# Patient Record
Sex: Female | Born: 1945
Health system: Southern US, Community
[De-identification: ages and names within clinical notes are randomized; demographics above are authoritative.]

## PROBLEM LIST (undated history)

## (undated) DIAGNOSIS — F32A Depression, unspecified: Secondary | ICD-10-CM

## (undated) DIAGNOSIS — I10 Essential (primary) hypertension: Secondary | ICD-10-CM

## (undated) DIAGNOSIS — E079 Disorder of thyroid, unspecified: Secondary | ICD-10-CM

## (undated) DIAGNOSIS — E785 Hyperlipidemia, unspecified: Secondary | ICD-10-CM

## (undated) HISTORY — DX: Disorder of thyroid, unspecified: E07.9

## (undated) HISTORY — DX: Essential (primary) hypertension: I10

## (undated) HISTORY — DX: Depression, unspecified: F32.A

## (undated) HISTORY — PX: OTHER SURGICAL HISTORY: SHX169

## (undated) HISTORY — DX: Hyperlipidemia, unspecified: E78.5

## (undated) HISTORY — PX: TUBAL LIGATION: SHX77

---

## 2013-07-23 ENCOUNTER — Ambulatory Visit: Payer: Self-pay | Admitting: Family Medicine

## 2013-08-25 ENCOUNTER — Ambulatory Visit (INDEPENDENT_AMBULATORY_CARE_PROVIDER_SITE_OTHER): Payer: Medicare Other | Admitting: Family Medicine

## 2013-08-25 VITALS — BP 126/80 | HR 66 | Temp 97.8°F | Resp 16 | Wt 164.0 lb

## 2013-08-25 DIAGNOSIS — R748 Abnormal levels of other serum enzymes: Secondary | ICD-10-CM

## 2013-08-25 DIAGNOSIS — E039 Hypothyroidism, unspecified: Secondary | ICD-10-CM

## 2013-08-25 DIAGNOSIS — I10 Essential (primary) hypertension: Secondary | ICD-10-CM

## 2013-08-25 LAB — CBC
HEMATOCRIT: 43.6 % (ref 36.0–46.0)
Hemoglobin: 14.8 g/dL (ref 12.0–15.0)
MCH: 27.2 pg (ref 26.0–34.0)
MCHC: 33.9 g/dL (ref 30.0–36.0)
MCV: 80.1 fL (ref 78.0–100.0)
PLATELETS: 298 10*3/uL (ref 150–400)
RBC: 5.44 MIL/uL — ABNORMAL HIGH (ref 3.87–5.11)
RDW: 15.1 % (ref 11.5–15.5)
WBC: 4.9 10*3/uL (ref 4.0–10.5)

## 2013-08-25 MED ORDER — LEVOTHYROXINE SODIUM 300 MCG PO TABS
300.0000 ug | ORAL_TABLET | Freq: Every day | ORAL | Status: DC
Start: 2013-08-25 — End: 2013-08-28

## 2013-08-25 MED ORDER — POTASSIUM CHLORIDE CRYS ER 10 MEQ PO TBCR: 10.0000 meq | EXTENDED_RELEASE_TABLET | Freq: Every day | ORAL | Status: DC

## 2013-08-25 MED ORDER — DILTIAZEM HCL ER BEADS 180 MG PO CP24: 180.0000 mg | ORAL_CAPSULE | Freq: Every day | ORAL | Status: DC

## 2013-08-25 MED ORDER — HYDROCHLOROTHIAZIDE 25 MG PO TABS: 25.0000 mg | ORAL_TABLET | Freq: Every day | ORAL | Status: DC

## 2013-08-25 NOTE — Patient Instructions (Signed)
I will be in touch with your regarding your labs when they come in.  If you wish, you can wait and fill the synthroid until we get your TSH back in case we need to adjust.  Good to see you today!

## 2013-08-25 NOTE — Progress Notes (Addendum)
[facility] [address], [place] [address] [phone]  Date:  08/25/2013   Name:  Isabella Berg   DOB:  10/02/1945   MRN:  [id number]  PCP:  No primary provider on file.    Chief Complaint: Medication Refill and Establish PCP   History of Present Illness:  Isabella Berg is a 68 y.o. very pleasant female patient who presents with the following:  She is here today as a new patient- she has been taking care of her husband who passed a few months ago,  She is now having time to check up on herself.    No recent TSH check - she is stable on 300 mcg for some time.  She had a thyroidectomy, but did not have cancer  She declines a flu shot and shingles vaccine today  There are no active problems to display for this patient.   Past Medical History  Diagnosis Date  . Hypertension   . Thyroid disease   . Hyperlipidemia     Past Surgical History  Procedure Laterality Date  . Thyroid      Removal 2002    History  Substance Use Topics  . Smoking status: Former Games developer  . Smokeless tobacco: Not on file  . Alcohol Use: No    Family History  Problem Relation Age of Onset  . Hyperlipidemia Mother   . Cancer Father   . Hypertension Brother   . Stroke Brother   . Hyperlipidemia Brother     No Known Allergies  Medication list has been reviewed and updated.  No current outpatient prescriptions on file prior to visit.   No current facility-administered medications on file prior to visit.    Review of Systems:  As per HPI- otherwise negative.   Physical Examination: Filed Vitals:   08/25/13 1052  BP: 126/80  Pulse: 66  Temp: 97.8 F (36.6 C)  Resp: 16   Filed Vitals:   08/25/13 1052  Weight: 164 lb (74.39 kg)   There is no height on file to calculate BMI. Ideal Body Weight:    GEN: WDWN, NAD, Non-toxic, A & O x 3, mild overweight, looks well HEENT: Atraumatic, Normocephalic. Neck supple. No masses, No LAD. Ears and Nose: No  external deformity. CV: RRR, No M/G/R. No JVD. No thrill. No extra heart sounds. PULM: CTA B, no wheezes, crackles, rhonchi. No retractions. No resp. distress. No accessory muscle use. EXTR: No c/c/e NEURO Normal gait.  PSYCH: Normally interactive. Conversant. Not depressed or anxious appearing.  Calm demeanor.    Assessment and Plan: Unspecified hypothyroidism - Plan: levothyroxine (SYNTHROID, LEVOTHROID) 300 MCG tablet, TSH  HTN (hypertension) - Plan: hydrochlorothiazide (HYDRODIURIL) 25 MG tablet, diltiazem (TIAZAC) 180 MG 24 hr capsule, potassium chloride (K-DUR,KLOR-CON) 10 MEQ tablet, Lipid panel, Comprehensive metabolic panel, CBC  Refilled her medications and check labs as above today. She plans to see [place] as her PCP and will plan follow-up with her labs.  Meds ordered this encounter  Medications  . DISCONTD: diltiazem (TIAZAC) 180 MG 24 hr capsule    Sig: Take 180 mg by mouth daily.  Marland Kitchen DISCONTD: potassium chloride (K-DUR,KLOR-CON) 10 MEQ tablet    Sig: Take 10 mEq by mouth 2 (two) times daily.  . calcium-vitamin D (OSCAL WITH D) 500-200 MG-UNIT per tablet    Sig: Take 1 tablet by mouth.  . DISCONTD: levothyroxine (SYNTHROID, LEVOTHROID) 300 MCG tablet    Sig: Take 300 mcg by mouth daily before breakfast.  .  DISCONTD: hydrochlorothiazide (HYDRODIURIL) 25 MG tablet    Sig: Take 25 mg by mouth daily.  . cholecalciferol (VITAMIN D) 400 UNITS TABS tablet    Sig: Take 400 Units by mouth.  . levothyroxine (SYNTHROID, LEVOTHROID) 300 MCG tablet    Sig: Take 1 tablet (300 mcg total) by mouth daily before breakfast.    Dispense:  30 tablet    Refill:  11  . hydrochlorothiazide (HYDRODIURIL) 25 MG tablet    Sig: Take 1 tablet (25 mg total) by mouth daily.    Dispense:  90 tablet    Refill:  3  . diltiazem (TIAZAC) 180 MG 24 hr capsule    Sig: Take 1 capsule (180 mg total) by mouth daily.    Dispense:  30 capsule    Refill:  11  . potassium chloride (K-DUR,KLOR-CON) 10 MEQ tablet     Sig: Take 1 tablet (10 mEq total) by mouth daily.    Dispense:  30 tablet    Refill:  11    Signed Abbe AmsterdamJessica Eliyohu Class, MD  Called her to go over labs.  Her TSH is up a bit.  Admits she has not always been taking her medication as she should, would like to check again in one month.  Also, her cholesterol is up.  Discussed CHL medication.  She would like to try a trial of lifestyle changes first, and we will plan to check again in 6 months.   I will also recheck her AP in a month.

## 2013-08-26 ENCOUNTER — Encounter: Payer: Self-pay | Admitting: Family Medicine

## 2013-08-26 LAB — COMPREHENSIVE METABOLIC PANEL
ALT: 15 U/L (ref 0–35)
AST: 17 U/L (ref 0–37)
Albumin: 4.2 g/dL (ref 3.5–5.2)
Alkaline Phosphatase: 123 U/L — ABNORMAL HIGH (ref 39–117)
BUN: 13 mg/dL (ref 6–23)
CO2: 29 meq/L (ref 19–32)
CREATININE: 0.77 mg/dL (ref 0.50–1.10)
Calcium: 9.4 mg/dL (ref 8.4–10.5)
Chloride: 103 mEq/L (ref 96–112)
Glucose, Bld: 98 mg/dL (ref 70–99)
Potassium: 3.9 mEq/L (ref 3.5–5.3)
SODIUM: 142 meq/L (ref 135–145)
TOTAL PROTEIN: 6.9 g/dL (ref 6.0–8.3)
Total Bilirubin: 0.5 mg/dL (ref 0.2–1.2)

## 2013-08-26 LAB — LIPID PANEL
CHOL/HDL RATIO: 3.8 ratio
Cholesterol: 293 mg/dL — ABNORMAL HIGH (ref 0–200)
HDL: 78 mg/dL (ref >39)
LDL Cholesterol: 199 mg/dL — ABNORMAL HIGH (ref 0–99)
Triglycerides: 79 mg/dL (ref <150)
VLDL: 16 mg/dL (ref 0–40)

## 2013-08-26 LAB — TSH: TSH: 7.334 u[IU]/mL — AB (ref 0.350–4.500)

## 2013-08-26 NOTE — Addendum Note (Signed)
Addended by: Abbe AmsterdamOPLAND, Kadesha Virrueta C on: 08/26/2013 07:54 PM   Modules accepted: Orders

## 2013-08-28 ENCOUNTER — Other Ambulatory Visit: Payer: Self-pay | Admitting: Emergency Medicine

## 2013-08-28 DIAGNOSIS — E039 Hypothyroidism, unspecified: Secondary | ICD-10-CM

## 2013-08-28 MED ORDER — LEVOTHYROXINE SODIUM 88 MCG PO TABS: 88.0000 ug | ORAL_TABLET | Freq: Every day | ORAL | Status: DC

## 2014-08-15 ENCOUNTER — Encounter: Payer: Self-pay | Admitting: *Deleted

## 2014-08-23 ENCOUNTER — Other Ambulatory Visit: Payer: Self-pay | Admitting: Emergency Medicine

## 2014-09-06 ENCOUNTER — Other Ambulatory Visit: Payer: Self-pay | Admitting: Family Medicine

## 2014-09-07 ENCOUNTER — Encounter: Payer: Self-pay | Admitting: Family Medicine

## 2014-10-01 ENCOUNTER — Other Ambulatory Visit: Payer: Self-pay | Admitting: Physician Assistant

## 2014-10-01 DIAGNOSIS — E038 Other specified hypothyroidism: Secondary | ICD-10-CM

## 2014-10-03 ENCOUNTER — Other Ambulatory Visit: Payer: Self-pay | Admitting: Family Medicine

## 2014-10-03 MED ORDER — LEVOTHYROXINE SODIUM 300 MCG PO TABS: 300.0000 ug | ORAL_TABLET | Freq: Every day | ORAL | Status: DC

## 2014-10-03 NOTE — Telephone Encounter (Signed)
Pt has appt 12/12/14, but hasn't been seen since 08/2013. Do you want to give RFs until appt?

## 2014-10-03 NOTE — Telephone Encounter (Signed)
Dr Patsy Lageropland, pt has appt sch for 12/12/14. Do you want to RF these until then? Pt hasn't been seen since 08/2013.

## 2014-10-03 NOTE — Telephone Encounter (Signed)
I have not seen her in over a year.  She is on 300mcg of nthroid, no .  Made this correction

## 2014-10-04 NOTE — Telephone Encounter (Signed)
Did these refills but called her and LMOM- would really like to see her sooner than June. Her labs are way overdue. Please see me or someone else in the walk-in or try to move her apt up

## 2014-10-06 ENCOUNTER — Ambulatory Visit (INDEPENDENT_AMBULATORY_CARE_PROVIDER_SITE_OTHER): Payer: Medicare Other | Admitting: Family Medicine

## 2014-10-06 VITALS — BP 118/70 | HR 76 | Temp 98.2°F | Resp 18 | Ht 63.0 in | Wt 157.0 lb

## 2014-10-06 DIAGNOSIS — E038 Other specified hypothyroidism: Secondary | ICD-10-CM

## 2014-10-06 MED ORDER — LEVOTHYROXINE SODIUM 88 MCG PO TABS: 88.0000 ug | ORAL_TABLET | Freq: Every day | ORAL | Status: DC

## 2014-10-06 NOTE — Progress Notes (Signed)
[facility] [address], [place] [address] [phone]  Date:  10/06/2014   Name:  Isabella Berg   DOB:  25-Dec-1945   MRN:  [id number]  PCP:  Abbe AmsterdamOPLAND,Harlea Goetzinger, MD    Chief Complaint: Follow-up   History of Present Illness:  Isabella Berg is a 69 y.o. very pleasant female patient who presents with the following:  Here today to follow-up her hypothyroidism.  She is taking 88 mcg at home- she has NEVER been taking 300 mcg.  Unsure where this confusion came from.  Called the pharmacy and made sure that she had been getting the 88 mcg  There are no active problems to display for this patient.   Past Medical History  Diagnosis Date  . Hypertension   . Thyroid disease   . Hyperlipidemia     Past Surgical History  Procedure Laterality Date  . Thyroid      Removal 2002    History  Substance Use Topics  . Smoking status: Former Games developermoker  . Smokeless tobacco: Not on file  . Alcohol Use: No    Family History  Problem Relation Age of Onset  . Hyperlipidemia Mother   . Cancer Father   . Hypertension Brother   . Stroke Brother   . Hyperlipidemia Brother     No Known Allergies  Medication list has been reviewed and updated.  Current Outpatient Prescriptions on File Prior to Visit  Medication Sig Dispense Refill  . diltiazem (TIAZAC) 180 MG 24 hr capsule TAKE 1 CAPSULE (180 MG TOTAL) BY MOUTH DAILY. 30 capsule 2  . hydrochlorothiazide (HYDRODIURIL) 25 MG tablet TAKE 1 TABLET (25 MG TOTAL) BY MOUTH DAILY. 90 tablet 0  . KLOR-CON M10 10 MEQ tablet TAKE 1 TABLET (10 MEQ TOTAL) BY MOUTH DAILY. 30 tablet 2  . levothyroxine (SYNTHROID, LEVOTHROID) 300 MCG tablet Take 1 tablet (300 mcg total) by mouth daily before breakfast. 30 tablet 1   No current facility-administered medications on file prior to visit.    Review of Systems:  As per HPI- otherwise negative.     Physical Examination: Filed Vitals:   10/06/14 1535  BP: 118/70  Pulse: 76   Temp: 98.2 F (36.8 C)  Resp: 18   Filed Vitals:   10/06/14 1535  Height: 5\' 3"  (1.6 m)  Weight: 157 lb (71.215 kg)   Body mass index is 27.82 kg/(m^2). Ideal Body Weight: Weight in (lb) to have BMI = 25: 140.8  GEN: WDWN, NAD, Non-toxic, A & O x 3, overweight, looks well HEENT: Atraumatic, Normocephalic. Neck supple. No masses, No LAD. Ears and Nose: No external deformity. CV: RRR, No M/G/R. No JVD. No thrill. No extra heart sounds. PULM: CTA B, no wheezes, crackles, rhonchi. No retractions. No resp. distress. No accessory muscle use. EXTR: No c/c/e NEURO Normal gait.  PSYCH: Normally interactive. Conversant. Not depressed or anxious appearing.  Calm demeanor.    Assessment and Plan: Other specified hypothyroidism - Plan: TSH, levothyroxine (SYNTHROID, LEVOTHROID) 88 MCG tablet  Check TSH today.  She did not pick up the 300 mcg which is good because she has been taking 88 mcg.  We are unsure where this miscommunication occurred. I will call her with her TSH- she has enough thyroid medication to take today and tomorrow in case we need to adjust her dose  Signed Abbe AmsterdamJessica Cattleya Dobratz, MD

## 2014-10-06 NOTE — Patient Instructions (Signed)
We will check your TSH today- do not fill your thyroid medication until I call you in case we need to change your dose.

## 2014-10-07 ENCOUNTER — Encounter: Payer: Self-pay | Admitting: Family Medicine

## 2014-10-07 LAB — TSH: TSH: 1.224 u[IU]/mL (ref 0.350–4.500)

## 2014-11-25 ENCOUNTER — Encounter: Payer: Self-pay | Admitting: *Deleted

## 2014-12-12 ENCOUNTER — Encounter: Payer: Self-pay | Admitting: Family Medicine

## 2014-12-12 ENCOUNTER — Ambulatory Visit (INDEPENDENT_AMBULATORY_CARE_PROVIDER_SITE_OTHER): Payer: Medicare Other | Admitting: Family Medicine

## 2014-12-12 ENCOUNTER — Other Ambulatory Visit: Payer: Self-pay | Admitting: Family Medicine

## 2014-12-12 VITALS — BP 140/90 | HR 67 | Temp 97.8°F | Resp 16 | Ht 63.0 in | Wt 157.0 lb

## 2014-12-12 DIAGNOSIS — G5602 Carpal tunnel syndrome, left upper limb: Secondary | ICD-10-CM

## 2014-12-12 DIAGNOSIS — M8580 Other specified disorders of bone density and structure, unspecified site: Secondary | ICD-10-CM

## 2014-12-12 DIAGNOSIS — Z13220 Encounter for screening for lipoid disorders: Secondary | ICD-10-CM | POA: Diagnosis not present

## 2014-12-12 DIAGNOSIS — I10 Essential (primary) hypertension: Secondary | ICD-10-CM | POA: Diagnosis not present

## 2014-12-12 DIAGNOSIS — Z0000 Encounter for general adult medical examination without abnormal findings: Secondary | ICD-10-CM | POA: Diagnosis not present

## 2014-12-12 DIAGNOSIS — Z130 Encounter for screening for diseases of the blood and blood-forming organs and certain disorders involving the immune mechanism: Secondary | ICD-10-CM

## 2014-12-12 DIAGNOSIS — F4321 Adjustment disorder with depressed mood: Secondary | ICD-10-CM | POA: Diagnosis not present

## 2014-12-12 DIAGNOSIS — G5601 Carpal tunnel syndrome, right upper limb: Secondary | ICD-10-CM

## 2014-12-12 DIAGNOSIS — E890 Postprocedural hypothyroidism: Secondary | ICD-10-CM

## 2014-12-12 DIAGNOSIS — Z131 Encounter for screening for diabetes mellitus: Secondary | ICD-10-CM

## 2014-12-12 DIAGNOSIS — G5603 Carpal tunnel syndrome, bilateral upper limbs: Secondary | ICD-10-CM

## 2014-12-12 LAB — CBC
HEMATOCRIT: 43.3 % (ref 36.0–46.0)
HEMOGLOBIN: 14.9 g/dL (ref 12.0–15.0)
MCH: 27.6 pg (ref 26.0–34.0)
MCHC: 34.4 g/dL (ref 30.0–36.0)
MCV: 80.3 fL (ref 78.0–100.0)
MPV: 9.9 fL (ref 8.6–12.4)
Platelets: 285 10*3/uL (ref 150–400)
RBC: 5.39 MIL/uL — ABNORMAL HIGH (ref 3.87–5.11)
RDW: 14.8 % (ref 11.5–15.5)
WBC: 4 10*3/uL (ref 4.0–10.5)

## 2014-12-12 LAB — LIPID PANEL
CHOLESTEROL: 244 mg/dL — AB (ref 0–200)
HDL: 90 mg/dL (ref >=46)
LDL Cholesterol: 143 mg/dL — ABNORMAL HIGH (ref 0–99)
TRIGLYCERIDES: 54 mg/dL (ref <150)
Total CHOL/HDL Ratio: 2.7 Ratio
VLDL: 11 mg/dL (ref 0–40)

## 2014-12-12 LAB — COMPREHENSIVE METABOLIC PANEL
ALK PHOS: 129 U/L — AB (ref 39–117)
ALT: 13 U/L (ref 0–35)
AST: 15 U/L (ref 0–37)
Albumin: 4 g/dL (ref 3.5–5.2)
BUN: 13 mg/dL (ref 6–23)
CO2: 28 mEq/L (ref 19–32)
Calcium: 9.4 mg/dL (ref 8.4–10.5)
Chloride: 104 mEq/L (ref 96–112)
Creat: 0.78 mg/dL (ref 0.50–1.10)
GLUCOSE: 98 mg/dL (ref 70–99)
POTASSIUM: 3.7 meq/L (ref 3.5–5.3)
Sodium: 142 mEq/L (ref 135–145)
TOTAL PROTEIN: 7 g/dL (ref 6.0–8.3)
Total Bilirubin: 0.5 mg/dL (ref 0.2–1.2)

## 2014-12-12 LAB — TSH: TSH: 0.889 u[IU]/mL (ref 0.350–4.500)

## 2014-12-12 MED ORDER — DILTIAZEM HCL ER BEADS 180 MG PO CP24: ORAL_CAPSULE | ORAL | Status: DC

## 2014-12-12 MED ORDER — POTASSIUM CHLORIDE CRYS ER 10 MEQ PO TBCR: EXTENDED_RELEASE_TABLET | ORAL | Status: DC

## 2014-12-12 MED ORDER — HYDROCHLOROTHIAZIDE 25 MG PO TABS: ORAL_TABLET | ORAL | Status: DC

## 2014-12-12 NOTE — Progress Notes (Signed)
[facility] [address], [place] [address] [phone]  Date:  12/12/2014   Name:  Isabella Berg   DOB:  01-Sep-1945   MRN:  [id number]  PCP:  Abbe Amsterdam, MD    Chief Complaint: Annual Exam   History of Present Illness:  Isabella Berg is a 69 y.o. very pleasant female patient who presents with the following:  Here today for a CPE.  She has a history of HTN, hyperlipidemia and post- op hypothyroidism  Former smoker.  We had to adjust her thyroid medication in the spring but it was fine on recheck No history of abnormal pap- last pap in 2011 at the age of 16.  She is not SA Fasting today for labs- would like to have BW today  She has lost weight- her appetite has not been as good.  Her husband died 2 years ago- she went through counseling for her acute grief.  She cares for her mother, and her daughter and grandchild live here in [place] as well.  Overall she feels that she sometimes feels sad and quiet, but is not truly depressed and does not wish to use any medication for same.  Denies any SI  She does have a prior dx of CTS- made a few years ago.  No operation done.  For a few weeks now she has noted hand and wrist pain, worse in the am, in both hands.  Sx concentrated in her index and long fingers  She declines pneumonia vaccine, zostavax, and colonoscopy referral Noted to have osteopenia on her last dexa- ok with referral for this and for mammogram   Wt Readings from Last 3 Encounters:  12/12/14 157 lb (71.215 kg)  10/06/14 157 lb (71.215 kg)  08/25/13 164 lb (74.39 kg)     There are no active problems to display for this patient.   Past Medical History  Diagnosis Date  . Hypertension   . Thyroid disease   . Hyperlipidemia     Past Surgical History  Procedure Laterality Date  . Thyroid      Removal 2002    History  Substance Use Topics  . Smoking status: Former Games developer  . Smokeless tobacco: Not on file  . Alcohol Use: No     Family History  Problem Relation Age of Onset  . Hyperlipidemia Mother   . Cancer Father   . Hypertension Brother   . Stroke Brother   . Hyperlipidemia Brother     No Known Allergies  Medication list has been reviewed and updated.  Current Outpatient Prescriptions on File Prior to Visit  Medication Sig Dispense Refill  . diltiazem (TIAZAC) 180 MG 24 hr capsule TAKE 1 CAPSULE (180 MG TOTAL) BY MOUTH DAILY. 30 capsule 2  . hydrochlorothiazide (HYDRODIURIL) 25 MG tablet TAKE 1 TABLET (25 MG TOTAL) BY MOUTH DAILY. 90 tablet 0  . KLOR-CON M10 10 MEQ tablet TAKE 1 TABLET (10 MEQ TOTAL) BY MOUTH DAILY. 30 tablet 2  . levothyroxine (SYNTHROID, LEVOTHROID) 88 MCG tablet Take 1 tablet (88 mcg total) by mouth daily. 90 tablet 3   No current facility-administered medications on file prior to visit.    Review of Systems:  As per HPI- otherwise negative.   Physical Examination: Filed Vitals:   12/12/14 0936  BP: 144/88  Pulse: 67  Temp: 97.8 F (36.6 C)  Resp: 16   Filed Vitals:   12/12/14 0936  Height:  (1.6 m)  Weight: 157 lb (71.215 kg)  Body mass index is 27.82 kg/(m^2). Ideal Body Weight: Weight in (lb) to have BMI = 25: 140.8  GEN: WDWN, NAD, Non-toxic, A & O x 3, mild overweight, looks well and younger than age HEENT: Atraumatic, Normocephalic. Neck supple. No masses, No LAD.  Bilateral TM wnl, oropharynx normal.  PEERL,EOMI.   Ears and Nose: No external deformity. CV: RRR, No M/G/R. No JVD. No thrill. No extra heart sounds. PULM: CTA B, no wheezes, crackles, rhonchi. No retractions. No resp. distress. No accessory muscle use. ABD: S, NT, ND. No rebound. No HSM. EXTR: No c/c/e NEURO Normal gait.  PSYCH: Normally interactive. Conversant. Not depressed or anxious appearing.  Calm demeanor.    Assessment and Plan: Physical exam  Essential hypertension - Plan: hydrochlorothiazide (HYDRODIURIL) 25 MG tablet, potassium chloride (KLOR-CON M10) 10 MEQ tablet,  diltiazem (TIAZAC) 180 MG 24 hr capsule  Post-operative hypothyroidism - Plan: TSH  Grief reaction  Osteopenia - Plan: Vitamin D, 25-hydroxy  Screening for diabetes mellitus - Plan: Comprehensive metabolic panel  Screening for hyperlipidemia - Plan: Lipid panel  Screening for deficiency anemia - Plan: CBC  Labs pending as above, BP controlled, check TSH Encouraged her to use wrist braces on both hands at night for a few weeks- let me know if not helpful She does not desire treatment for depression now Message to referrals regarding mammo and dexa for her  Signed Abbe Amsterdam, MD

## 2014-12-12 NOTE — Patient Instructions (Signed)
Great to see you today I will be in touch with your labs We will get you set up for a mammogram and bone density- let me know if you do not hear about these appts Please consider setting up a colonoscopy- let me know if you need help with this You can simply contact the gastroenterologist of your choice!  Please also think about getting a shingles shot and pneumonia vaccine Let me know if you change your mind about medication for mood

## 2014-12-13 ENCOUNTER — Encounter: Payer: Self-pay | Admitting: Family Medicine

## 2014-12-13 LAB — VITAMIN D 25 HYDROXY (VIT D DEFICIENCY, FRACTURES): VIT D 25 HYDROXY: 29 ng/mL — AB (ref 30–100)

## 2014-12-14 LAB — GAMMA GT: GGT: 20 U/L (ref 7–51)

## 2014-12-15 ENCOUNTER — Telehealth: Payer: Self-pay | Admitting: Family Medicine

## 2014-12-15 ENCOUNTER — Encounter: Payer: Self-pay | Admitting: Family Medicine

## 2014-12-15 DIAGNOSIS — R748 Abnormal levels of other serum enzymes: Secondary | ICD-10-CM

## 2014-12-15 NOTE — Telephone Encounter (Signed)
Sent letter

## 2015-01-06 ENCOUNTER — Other Ambulatory Visit: Payer: Self-pay

## 2015-01-06 DIAGNOSIS — Z1231 Encounter for screening mammogram for malignant neoplasm of breast: Secondary | ICD-10-CM

## 2015-01-06 DIAGNOSIS — Z8739 Personal history of other diseases of the musculoskeletal system and connective tissue: Secondary | ICD-10-CM

## 2015-03-10 ENCOUNTER — Other Ambulatory Visit: Payer: Self-pay | Admitting: Family Medicine

## 2015-03-18 ENCOUNTER — Other Ambulatory Visit: Payer: Self-pay | Admitting: Family Medicine

## 2015-04-05 ENCOUNTER — Telehealth: Payer: Self-pay | Admitting: *Deleted

## 2015-04-05 NOTE — Telephone Encounter (Signed)
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 Patsy Lageropland she needs to come in to be seen because of depression.  Spoke with pt and she will come in on Friday 10/21 to be seen.  Form will be at nurses station at 102.

## 2015-04-05 NOTE — Telephone Encounter (Signed)
The form will be kept in Dr. Brayton Laymanoplands box.

## 2015-04-07 ENCOUNTER — Ambulatory Visit (INDEPENDENT_AMBULATORY_CARE_PROVIDER_SITE_OTHER): Payer: Medicare Other | Admitting: Family Medicine

## 2015-04-07 VITALS — BP 124/80 | HR 74 | Temp 98.0°F | Resp 17 | Ht 63.0 in | Wt 154.0 lb

## 2015-04-07 DIAGNOSIS — S61502A Unspecified open wound of left wrist, initial encounter: Secondary | ICD-10-CM | POA: Diagnosis not present

## 2015-04-07 DIAGNOSIS — Z111 Encounter for screening for respiratory tuberculosis: Secondary | ICD-10-CM

## 2015-04-07 DIAGNOSIS — I10 Essential (primary) hypertension: Secondary | ICD-10-CM

## 2015-04-07 DIAGNOSIS — Z23 Encounter for immunization: Secondary | ICD-10-CM

## 2015-04-07 NOTE — Patient Instructions (Signed)
Good to see you today Take care and best of luck with your new job! I do recommend that you consider getting the pneumonia and flu shots Also it looks like you are overdue for a mammogram and colonoscopy  Please call and schedule a mammogram.  There are several options in town, one good choice is:  [facility] ?  Address: [address], [place], [place] [address]  [phone]

## 2015-04-07 NOTE — Progress Notes (Signed)
[facility] [address], [place] [address] [phone]  Date:  04/07/2015   Name:  Isabella EssexSandra Berg   DOB:  Dec 31, 1945   MRN:  [id number]  PCP:  Abbe AmsterdamOPLAND,Isabella Maker, MD    Chief Complaint: Follow-up   History of Present Illness:  Isabella Berg is a 69 y.o. 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  Last seen in June at which time she was suffering from grief and sadness. She is improved in this regard. She needs form done [place] [employer] [place]- she is already working with the [employer]  She feels that her mood is doing well currently, she is excited about this new opportunity  She has had TB testing at some point in the past- never tested positive  She had a tetanus shot about 10 years ago. The exact date is uncertain, she would like to update if potentially due She declines a flu shot today, also declines a pneumonia shot and zostavax She had all of her childhood shots.  She has a scrape on her left wrist recently which is healed now   Tuberculosis Risk Questionnaire  1. No Were you born outside the [place] in one of the following parts of the world: [place], [place], [place], [place] or [place]?    2. No Have you traveled outside the [place] and lived for more than one month in one of the following parts of the world: [place], [place], [place], [place] or [place]?    3. No Do you have a compromised immune system such as from any of the following conditions:HIV/AIDS, organ or bone marrow transplantation, diabetes, immunosuppressive medicines (e.g. Prednisone, Remicaide), leukemia, lymphoma, cancer of the head or neck, gastrectomy or jejunal bypass, end-stage renal disease (on dialysis), or silicosis?     4. No Have you ever or do you plan on working in: a residential  care center, a health care facility, a jail or prison or homeless shelter?    5. No Have you ever: injected illegal drugs, used crack cocaine, lived in a homeless shelter  or been in jail or prison?     6. No Have you ever been exposed to anyone with infectious tuberculosis?    Tuberculosis Symptom Questionnaire  Do you currently have any of the following symptoms?  1. No Unexplained cough lasting more than 3 weeks?   2. No Unexplained fever lasting more than 3 weeks.   3. No Night Sweats (sweating that leaves the bedclothes and sheets wet)    4. No Shortness of Breath   5. No Chest Pain   6. No Unintentional weight loss    7. No Unexplained fatigue (very tired for no reason)     BP Readings from Last 3 Encounters:  04/07/15 124/80  12/12/14 140/90  10/06/14 118/70     There are no active problems to display for this patient.   Past Medical History  Diagnosis Date  . Hypertension   . Thyroid disease   . Hyperlipidemia     Past Surgical History  Procedure Laterality Date  . Thyroid      Removal 2002    Social History  Substance Use Topics  . Smoking status: Former Games developermoker  . Smokeless tobacco: None  . Alcohol Use: No    Family History  Problem Relation  Age of Onset  . Hyperlipidemia Mother   . Cancer Father   . Hypertension Brother   . Stroke Brother   . Hyperlipidemia Brother     No Known Allergies  Medication list has been reviewed and updated.  Current Outpatient Prescriptions on File Prior to Visit  Medication Sig Dispense Refill  . cholecalciferol (VITAMIN D) 1000 UNITS tablet Take 1,000 Units by mouth daily.    Marland Kitchen diltiazem (TIAZAC) 180 MG 24 hr capsule TAKE 1 CAPSULE (180 MG TOTAL) BY MOUTH DAILY. 90 capsule 3  . hydrochlorothiazide (HYDRODIURIL) 25 MG tablet TAKE 1 TABLET (25 MG TOTAL) BY MOUTH DAILY. 90 tablet 3  . levothyroxine (SYNTHROID, LEVOTHROID) 88 MCG tablet Take 1 tablet (88 mcg total) by mouth daily. 90 tablet 3  .  potassium chloride (KLOR-CON M10) 10 MEQ tablet TAKE 1 TABLET (10 MEQ TOTAL) BY MOUTH DAILY. 90 tablet 3   No current facility-administered medications on file prior to visit.    Review of Systems:  As per HPI- otherwise negative.   Physical Examination: Filed Vitals:   04/07/15 0835  BP: 124/80  Pulse: 74  Temp: 98 F (36.7 C)  Resp: 17   Filed Vitals:   04/07/15 0835  Height:  (1.6 m)  Weight: 154 lb (69.854 kg)   Body mass index is 27.29 kg/(m^2). Ideal Body Weight: Weight in (lb) to have BMI = 25: 140.8  GEN: WDWN, NAD, Non-toxic, A & O x 3, looks well and younger than age HEENT: Atraumatic, Normocephalic. Neck supple. No masses, No LAD. Ears and Nose: No external deformity. CV: RRR, No M/G/R. No JVD. No thrill. No extra heart sounds. PULM: CTA B, no wheezes, crackles, rhonchi. No retractions. No resp. distress. No accessory muscle use. EXTR: No c/c/e NEURO Normal gait.  PSYCH: Normally interactive. Conversant. Not depressed or anxious appearing.  Calm demeanor.  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

## 2015-04-08 ENCOUNTER — Other Ambulatory Visit: Payer: Self-pay | Admitting: Family Medicine

## 2015-06-06 ENCOUNTER — Ambulatory Visit (INDEPENDENT_AMBULATORY_CARE_PROVIDER_SITE_OTHER): Payer: Medicare Other | Admitting: Urgent Care

## 2015-06-06 ENCOUNTER — Ambulatory Visit (INDEPENDENT_AMBULATORY_CARE_PROVIDER_SITE_OTHER): Payer: Medicare Other

## 2015-06-06 VITALS — BP 162/82 | HR 94 | Temp 100.1°F | Resp 16 | Ht 63.0 in | Wt 153.0 lb

## 2015-06-06 DIAGNOSIS — M1611 Unilateral primary osteoarthritis, right hip: Secondary | ICD-10-CM

## 2015-06-06 DIAGNOSIS — M25551 Pain in right hip: Secondary | ICD-10-CM

## 2015-06-06 DIAGNOSIS — R509 Fever, unspecified: Secondary | ICD-10-CM

## 2015-06-06 DIAGNOSIS — R05 Cough: Secondary | ICD-10-CM | POA: Diagnosis not present

## 2015-06-06 DIAGNOSIS — R059 Cough, unspecified: Secondary | ICD-10-CM

## 2015-06-06 DIAGNOSIS — J069 Acute upper respiratory infection, unspecified: Secondary | ICD-10-CM | POA: Diagnosis not present

## 2015-06-06 DIAGNOSIS — J029 Acute pharyngitis, unspecified: Secondary | ICD-10-CM | POA: Diagnosis not present

## 2015-06-06 LAB — POCT INFLUENZA A/B
Influenza A, POC: NEGATIVE
Influenza B, POC: NEGATIVE

## 2015-06-06 LAB — POCT RAPID STREP A (OFFICE): RAPID STREP A SCREEN: NEGATIVE

## 2015-06-06 MED ORDER — HYDROCODONE-HOMATROPINE 5-1.5 MG/5ML PO SYRP: 5.0000 mL | ORAL_SOLUTION | Freq: Every evening | ORAL | Status: DC | PRN

## 2015-06-06 MED ORDER — DICLOFENAC SODIUM 75 MG PO TBEC: 75.0000 mg | DELAYED_RELEASE_TABLET | Freq: Two times a day (BID) | ORAL | Status: DC

## 2015-06-06 MED ORDER — BENZONATATE 100 MG PO CAPS
100.0000 mg | ORAL_CAPSULE | Freq: Three times a day (TID) | ORAL | Status: DC | PRN
Start: 2015-06-06 — End: 2015-07-19

## 2015-06-06 NOTE — Progress Notes (Signed)
MRN: [id number] DOB: [birth year]  Subjective:   Isabella Berg is a 69 y.o. female presenting for chief complaint of Chills; sneezing, scratchy throat; and Hip Pain  URI - reports 1 day history of sinus congestion, postnasal drainage, headache, watery eyes, sneezing, scratchy throat, dry cough, chills, malaise. Patient has had sick contacts, has been working at an [employer]. Has not tried any medications for relief. Denies n/v, abdominal pain, chest pain, shob, wheezing, ear pain, ear drainage, tooth pain, eye pain.  Hip pain - reports >6 month history of right hip pain. Pain is achy in nature, occurs daily, does not radiate, worse with increased activity. Has tried Asper cream, Tylenol, rubbing alcohol. Denies hip fracture but ~10 years patient suffered a fall from steps and believes this may have impacted her hip. Patient's mother has a history of arthritis in her hip. Denies trauma, swelling, redness, bony deformity, incontinence, back pain.  Isabella Berg has a current medication list which includes the following prescription(s): diltiazem, hydrochlorothiazide, levothyroxine, potassium chloride, and cholecalciferol. Also has No Known Allergies.  Isabella Berg  has a past medical history of Hypertension; Thyroid disease; and Hyperlipidemia. Also  has past surgical history that includes thyroid.  Objective:   Vitals: BP 162/82 mmHg  Pulse 94  Temp(Src) 100.1 F (37.8 C) (Oral)  Resp 16  Ht  (1.6 m)  Wt 153 lb (69.4 kg)  BMI 27.11 kg/m2  SpO2 98%  Physical Exam  Constitutional: She is oriented to person, place, and time. She appears well-developed and well-nourished.  HENT:  TM's intact bilaterally, no effusions or erythema. Nasal turbinates pink and moist, nasal passages patent. No sinus tenderness. Oropharynx with slight erythema but no exudates.  Eyes: Right eye exhibits no discharge. Left eye exhibits no discharge. No scleral icterus.  Neck: Normal range of motion. Neck  supple.  Cardiovascular: Normal rate, regular rhythm and intact distal pulses.  Exam reveals no gallop and no friction rub.   No murmur heard. Pulmonary/Chest: No respiratory distress. She has no wheezes. She has no rales.  Abdominal: Soft. Bowel sounds are normal. She exhibits no distension and no mass. There is no tenderness.  Musculoskeletal: She exhibits no edema.  Lymphadenopathy:    She has no cervical adenopathy.  Neurological: She is alert and oriented to person, place, and time.  Skin: Skin is warm and dry. No rash noted. No erythema. No pallor.   Results for orders placed or performed in visit on 06/06/15 (from the past 24 hour(s))  POCT Influenza A/B     Status: Normal   Collection Time: 06/06/15  5:44 PM  Result Value Ref Range   Influenza A, POC Negative Negative   Influenza B, POC Negative Negative   [facility] request for hip x-ray by PA-Abbye Lao: please comment on degenerative changes.  Dg Hip Unilat W Or W/o Pelvis 2-3 Views Right  06/06/2015  CLINICAL DATA:  Pain.  No recent trauma. EXAM: DG HIP (WITH OR WITHOUT PELVIS) 2-3V RIGHT COMPARISON:  None. FINDINGS: Frontal pelvis as well as frontal and lateral right hip images were obtained. There is no acute fracture or dislocation. There is mild symmetric narrowing of both hip joints. No erosive change. There is osteoarthritic change in the pubic symphysis region as well as in the inferior right sacroiliac joint. IMPRESSION: Mild symmetric narrowing of both hip joints. Osteoarthritic change in the pubic symphysis and inferior right sacroiliac joint. No acute fracture or dislocation. Electronically Signed   By: Bretta Bang III M.D.  On: 06/06/2015 17:58   Assessment and Plan :   1. Upper respiratory infection 2. Fever, unspecified 3. Sore throat 4. Cough - Likely viral in etiology, patient does not have pnd so I do not suspect that this is related to HTN. I recommended supportive care. Hycodan and Tessalon for cough and sore  throat. Rest and rtc in 1 week if no improvement.  5. Osteoarthritis of right hip, unspecified osteoarthritis type 6. Right hip pain - Start diclofenac, counseled patient on potential for adverse effects. She is to stop this and switch to Tylenol if it does not help. Refer to orthopedics for consult of osteoarthritis.  Wallis BambergMario Karaline Buresh, PA-C [facility] [phone] 06/06/2015 5:12 PM

## 2015-06-06 NOTE — Patient Instructions (Signed)
Upper Respiratory Infection, Adult Most upper respiratory infections (URIs) are a viral infection of the air passages leading to the lungs. A URI affects the nose, throat, and upper air passages. The most common type of URI is nasopharyngitis and is typically referred to as "the common cold." URIs run their course and usually go away on their own. Most of the time, a URI does not require medical attention, but sometimes a bacterial infection in the upper airways can follow a viral infection. This is called a secondary infection. Sinus and middle ear infections are common types of secondary upper respiratory infections. Bacterial pneumonia can also complicate a URI. A URI can worsen asthma and chronic obstructive pulmonary disease (COPD). Sometimes, these complications can require emergency medical care and may be life threatening.  CAUSES Almost all URIs are caused by viruses. A virus is a type of germ and can spread from one person to another.  RISKS FACTORS You may be at risk for a URI if:   You smoke.   You have chronic heart or lung disease.  You have a weakened defense (immune) system.   You are very young or very old.   You have nasal allergies or asthma.  You work in crowded or poorly ventilated areas.  You work in health care facilities or schools. SIGNS AND SYMPTOMS  Symptoms typically develop 2-3 days after you come in contact with a cold virus. Most viral URIs last 7-10 days. However, viral URIs from the influenza virus (flu virus) can last 14-18 days and are typically more severe. Symptoms may include:   Runny or stuffy (congested) nose.   Sneezing.   Cough.   Sore throat.   Headache.   Fatigue.   Fever.   Loss of appetite.   Pain in your forehead, behind your eyes, and over your cheekbones (sinus pain).  Muscle aches.  DIAGNOSIS  Your health care provider may diagnose a URI by:  Physical exam.  Tests to check that your symptoms are not due to  another condition such as:  Strep throat.  Sinusitis.  Pneumonia.  Asthma. TREATMENT  A URI goes away on its own with time. It cannot be cured with medicines, but medicines may be prescribed or recommended to relieve symptoms. Medicines may help:  Reduce your fever.  Reduce your cough.  Relieve nasal congestion. HOME CARE INSTRUCTIONS   Take medicines only as directed by your health care provider.   Gargle warm saltwater or take cough drops to comfort your throat as directed by your health care provider.  Use a warm mist humidifier or inhale steam from a shower to increase air moisture. This may make it easier to breathe.  Drink enough fluid to keep your urine clear or pale yellow.   Eat soups and other clear broths and maintain good nutrition.   Rest as needed.   Return to work when your temperature has returned to normal or as your health care provider advises. You may need to stay home longer to avoid infecting others. You can also use a face mask and careful hand washing to prevent spread of the virus.  Increase the usage of your inhaler if you have asthma.   Do not use any tobacco products, including cigarettes, chewing tobacco, or electronic cigarettes. If you need help quitting, ask your health care provider. PREVENTION  The best way to protect yourself from getting a cold is to practice good hygiene.   Avoid oral or hand contact with people with cold   symptoms.   Wash your hands often if contact occurs.  There is no clear evidence that vitamin C, vitamin E, echinacea, or exercise reduces the chance of developing a cold. However, it is always recommended to get plenty of rest, exercise, and practice good nutrition.  SEEK MEDICAL CARE IF:   You are getting worse rather than better.   Your symptoms are not controlled by medicine.   You have chills.  You have worsening shortness of breath.  You have brown or red mucus.  You have yellow or brown nasal  discharge.  You have pain in your face, especially when you bend forward.  You have a fever.  You have swollen neck glands.  You have pain while swallowing.  You have white areas in the back of your throat. SEEK IMMEDIATE MEDICAL CARE IF:   You have severe or persistent:  Headache.  Ear pain.  Sinus pain.  Chest pain.  You have chronic lung disease and any of the following:  Wheezing.  Prolonged cough.  Coughing up blood.  A change in your usual mucus.  You have a stiff neck.  You have changes in your:  Vision.  Hearing.  Thinking.  Mood. MAKE SURE YOU:   Understand these instructions.  Will watch your condition.  Will get help right away if you are not doing well or get worse.   This information is not intended to replace advice given to you by your health care provider. Make sure you discuss any questions you have with your health care provider.   Document Released: 11/27/2000 Document Revised: 10/18/2014 Document Reviewed: 09/08/2013 Elsevier Interactive Patient Education 2016 Elsevier Inc.  

## 2015-06-08 LAB — CULTURE, GROUP A STREP: Organism ID, Bacteria: NORMAL

## 2015-06-13 ENCOUNTER — Telehealth: Payer: Self-pay

## 2015-06-13 NOTE — Telephone Encounter (Signed)
Patient is requesting a copy of her x-rays to take to a specialist appointment.   [phone]

## 2015-06-13 NOTE — Telephone Encounter (Signed)
Copy of message sent to xray. 

## 2015-06-26 ENCOUNTER — Other Ambulatory Visit: Payer: Self-pay | Admitting: Urgent Care

## 2015-07-07 ENCOUNTER — Encounter: Payer: Self-pay | Admitting: Family Medicine

## 2015-07-12 ENCOUNTER — Encounter: Payer: Self-pay | Admitting: Family Medicine

## 2015-07-19 ENCOUNTER — Ambulatory Visit (INDEPENDENT_AMBULATORY_CARE_PROVIDER_SITE_OTHER): Payer: Medicare Other | Admitting: Family Medicine

## 2015-07-19 VITALS — BP 120/80 | HR 72 | Temp 98.3°F | Resp 17 | Ht 63.0 in | Wt 147.0 lb

## 2015-07-19 DIAGNOSIS — J029 Acute pharyngitis, unspecified: Secondary | ICD-10-CM | POA: Diagnosis not present

## 2015-07-19 DIAGNOSIS — J069 Acute upper respiratory infection, unspecified: Secondary | ICD-10-CM

## 2015-07-19 DIAGNOSIS — J0100 Acute maxillary sinusitis, unspecified: Secondary | ICD-10-CM | POA: Diagnosis not present

## 2015-07-19 DIAGNOSIS — Z5329 Procedure and treatment not carried out because of patient's decision for other reasons: Secondary | ICD-10-CM | POA: Diagnosis not present

## 2015-07-19 DIAGNOSIS — R05 Cough: Secondary | ICD-10-CM | POA: Diagnosis not present

## 2015-07-19 DIAGNOSIS — R059 Cough, unspecified: Secondary | ICD-10-CM

## 2015-07-19 LAB — POCT RAPID STREP A (OFFICE): Rapid Strep A Screen: NEGATIVE

## 2015-07-19 MED ORDER — AMOXICILLIN-POT CLAVULANATE 875-125 MG PO TABS
1.0000 | ORAL_TABLET | Freq: Two times a day (BID) | ORAL | Status: DC
Start: 2015-07-19 — End: 2015-08-17

## 2015-07-19 MED ORDER — HYDROCODONE-HOMATROPINE 5-1.5 MG/5ML PO SYRP: 5.0000 mL | ORAL_SOLUTION | Freq: Every evening | ORAL | Status: DC | PRN

## 2015-07-19 MED ORDER — BENZONATATE 100 MG PO CAPS: 200.0000 mg | ORAL_CAPSULE | Freq: Two times a day (BID) | ORAL | Status: DC | PRN

## 2015-07-19 NOTE — Progress Notes (Signed)
 Chief Complaint:  Chief Complaint  Patient presents with  . Cough  . Sinusitis  . Nasal Congestion    HPI: Isabella Berg is a 70 y.o. female who reports to [facility] today complaining of thick mucus , cough, occ, sore thorat and drainage. Fever last week which was subjective . LAst week on Thursday abotu 1 week ago. Started with diarrhea. She has had chills. She teaches kindergarted. + sick contacts. No asthma or allergies. She has had sinus issues in the past.   Past Medical History  Diagnosis Date  . Hypertension   . Thyroid disease   . Hyperlipidemia    Past Surgical History  Procedure Laterality Date  . Thyroid      Removal 2002   Social History   Social History  . Marital Status: Widowed    Spouse Name: N/A  . Number of Children: N/A  . Years of Education: N/A   Social History Main Topics  . Smoking status: Former Games developer  . Smokeless tobacco: None  . Alcohol Use: No  . Drug Use: No  . Sexual Activity: Not Asked   Other Topics Concern  . None   Social History Narrative   Family History  Problem Relation Age of Onset  . Hyperlipidemia Mother   . Cancer Father   . Hypertension Brother   . Stroke Brother   . Hyperlipidemia Brother    No Known Allergies Prior to Admission medications   Medication Sig Start Date End Date Taking? Authorizing Provider  benzonatate (TESSALON) 100 MG capsule Take 1-2 capsules (100-200 mg total) by mouth 3 (three) times daily as needed for cough. 06/06/15  Yes Wallis Bamberg, PA-C  diltiazem (TIAZAC) 180 MG 24 hr capsule TAKE 1 CAPSULE (180 MG TOTAL) BY MOUTH DAILY. 12/12/14  Yes Jessica C Copland, MD  hydrochlorothiazide (HYDRODIURIL) 25 MG tablet TAKE 1 TABLET (25 MG TOTAL) BY MOUTH DAILY. 12/12/14  Yes Jessica C Copland, MD  HYDROcodone-homatropine (HYCODAN) 5-1.5 MG/5ML syrup Take 5 mLs by mouth at bedtime as needed. 06/06/15  Yes Wallis Bamberg, PA-C  levothyroxine (SYNTHROID, LEVOTHROID) 88 MCG tablet Take 1 tablet (88 mcg total)  by mouth daily. 10/06/14  Yes Jessica C Copland, MD  potassium chloride (KLOR-CON M10) 10 MEQ tablet TAKE 1 TABLET (10 MEQ TOTAL) BY MOUTH DAILY. 12/12/14  Yes Gwenlyn Found Copland, MD  cholecalciferol (VITAMIN D) 1000 UNITS tablet Take 1,000 Units by mouth daily. Reported on 07/19/2015    Historical Provider, MD  diclofenac (VOLTAREN) 75 MG EC tablet Take 1 tablet (75 mg total) by mouth 2 (two) times daily. Patient not taking: Reported on 07/19/2015 06/06/15   Wallis Bamberg, PA-C     ROS: The patient denies night sweats, unintentional weight loss, chest pain, palpitations, wheezing, dyspnea on exertion, nausea, vomiting, abdominal pain, dysuria, hematuria, melena, numbness, weakness, or tingling.   All other systems have been reviewed and were otherwise negative with the exception of those mentioned in the HPI and as above.    PHYSICAL EXAM: Filed Vitals:   07/19/15 1656  BP: 120/80  Pulse: 72  Temp: 98.3 F (36.8 C)  Resp: 17   Body mass index is 26.05 kg/(m^2).   General: Alert, no acute distress HEENT:  Normocephalic, atraumatic, oropharynx patent. EOMI, PERRLA Erythematous throat, no exudates, TM normal, + sinus tenderness, + erythematous/boggy nasal mucosa Cardiovascular:  Regular rate and rhythm, no rubs murmurs or gallops.  No Carotid bruits, radial pulse intact. No pedal edema.  Respiratory: Clear to  auscultation bilaterally.  No wheezes, rales, or rhonchi.  No cyanosis, no use of accessory musculature Abdominal: No organomegaly, abdomen is soft and non-tender, positive bowel sounds. No masses. Skin: No rashes. Neurologic: Facial musculature symmetric. Psychiatric: Patient acts appropriately throughout our interaction. Lymphatic: No cervical or submandibular lymphadenopathy Musculoskeletal: Gait intact. No edema, tenderness   LABS: Results for orders placed or performed in visit on 07/19/15  POCT rapid strep A  Result Value Ref Range   Rapid Strep A Screen Negative Negative      EKG/XRAY:   Primary read interpreted by Dr. Conley Rolls at [facility].   ASSESSMENT/PLAN: Encounter Diagnoses  Name Primary?  . Acute pharyngitis, unspecified pharyngitis type   . Upper respiratory infection   . Acute maxillary sinusitis, recurrence not specified Yes  . Cough    Rx augmentin, hycodan prn , tessalon perles prn   Gross sideeffects, risk and benefits, and alternatives of medications d/w patient. Patient is aware that all medications have potential sideeffects and we are unable to predict every sideeffect or drug-drug interaction that may occur.    DO  07/19/2015 8:05 PM

## 2015-07-19 NOTE — Patient Instructions (Signed)
Sinusitis, Adult  Sinusitis is redness, soreness, and puffiness (inflammation) of the air pockets in the bones of your face (sinuses). The redness, soreness, and puffiness can cause air and mucus to get trapped in your sinuses. This can allow germs to grow and cause an infection.   HOME CARE    Drink enough fluids to keep your pee (urine) clear or pale yellow.   Use a humidifier in your home.   Run a hot shower to create steam in the bathroom. Sit in the bathroom with the door closed. Breathe in the steam 3-4 times a day.   Put a warm, moist washcloth on your face 3-4 times a day, or as told by your doctor.   Use salt water sprays (saline sprays) to wet the thick fluid in your nose. This can help the sinuses drain.   Only take medicine as told by your doctor.  GET HELP RIGHT AWAY IF:    Your pain gets worse.   You have very bad headaches.   You are sick to your stomach (nauseous).   You throw up (vomit).   You are very sleepy (drowsy) all the time.   Your face is puffy (swollen).   Your vision changes.   You have a stiff neck.   You have trouble breathing.  MAKE SURE YOU:    Understand these instructions.   Will watch your condition.   Will get help right away if you are not doing well or get worse.     This information is not intended to replace advice given to you by your health care provider. Make sure you discuss any questions you have with your health care provider.     Document Released: 11/20/2007 Document Revised: 06/24/2014 Document Reviewed: 01/07/2012  Elsevier Interactive Patient Education 2016 Elsevier Inc.

## 2015-07-21 LAB — CULTURE, GROUP A STREP: Organism ID, Bacteria: NORMAL

## 2015-08-17 ENCOUNTER — Ambulatory Visit (INDEPENDENT_AMBULATORY_CARE_PROVIDER_SITE_OTHER): Payer: Medicare Other | Admitting: Physician Assistant

## 2015-08-17 DIAGNOSIS — E039 Hypothyroidism, unspecified: Secondary | ICD-10-CM | POA: Insufficient documentation

## 2015-08-17 DIAGNOSIS — I10 Essential (primary) hypertension: Secondary | ICD-10-CM | POA: Insufficient documentation

## 2015-08-17 DIAGNOSIS — R059 Cough, unspecified: Secondary | ICD-10-CM

## 2015-08-17 DIAGNOSIS — R05 Cough: Secondary | ICD-10-CM | POA: Diagnosis not present

## 2015-08-17 DIAGNOSIS — J029 Acute pharyngitis, unspecified: Secondary | ICD-10-CM | POA: Diagnosis not present

## 2015-08-17 DIAGNOSIS — R6889 Other general symptoms and signs: Secondary | ICD-10-CM | POA: Diagnosis not present

## 2015-08-17 LAB — POCT RAPID STREP A (OFFICE): Rapid Strep A Screen: NEGATIVE

## 2015-08-17 LAB — POCT INFLUENZA A/B
INFLUENZA A, POC: NEGATIVE
INFLUENZA B, POC: NEGATIVE

## 2015-08-17 MED ORDER — HYDROCODONE-HOMATROPINE 5-1.5 MG/5ML PO SYRP: 5.0000 mL | ORAL_SOLUTION | Freq: Four times a day (QID) | ORAL | Status: DC | PRN

## 2015-08-17 MED ORDER — OSELTAMIVIR PHOSPHATE 75 MG PO CAPS: 75.0000 mg | ORAL_CAPSULE | Freq: Two times a day (BID) | ORAL | Status: AC

## 2015-08-17 MED ORDER — GUAIFENESIN ER 1200 MG PO TB12: 1.0000 | ORAL_TABLET | Freq: Two times a day (BID) | ORAL | Status: AC

## 2015-08-17 NOTE — Progress Notes (Signed)
Isabella Berg  MRN: [id number] DOB: Jan 05, 1946  Subjective:  Pt presents to clinic with cold symptoms that started last night and just got worse all day.  She started with a sore throat and then today she has a cough with myalgias and congestion with PND and rhinorrhea.  She had chills all day today and could not get warm.    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 Benign essential HTN 08/17/2015  . Hypothyroidism 08/17/2015    Current Outpatient Prescriptions on File Prior to Visit  Medication Sig Dispense Refill  . diltiazem (TIAZAC) 180 MG 24 hr capsule TAKE 1 CAPSULE (180 MG TOTAL) BY MOUTH DAILY. 90 capsule 3  . hydrochlorothiazide (HYDRODIURIL) 25 MG tablet TAKE 1 TABLET (25 MG TOTAL) BY MOUTH DAILY. 90 tablet 3  . levothyroxine (SYNTHROID, LEVOTHROID) 88 MCG tablet Take 1 tablet (88 mcg total) by mouth daily. 90 tablet 3  . potassium chloride (KLOR-CON M10) 10 MEQ tablet TAKE 1 TABLET (10 MEQ TOTAL) BY MOUTH DAILY. 90 tablet 3  . diclofenac (VOLTAREN) 75 MG EC tablet Take 1 tablet (75 mg total) by mouth 2 (two) times daily. (Patient not taking: Reported on 07/19/2015) 30 tablet 0   No current facility-administered medications on file prior to visit.    No Known Allergies  Review of Systems  Constitutional: Positive for fever and chills.  HENT: Positive for congestion, postnasal drip, rhinorrhea (yellow) and sore throat.   Respiratory: Positive for cough.   Gastrointestinal: Negative for nausea, vomiting and diarrhea.  Musculoskeletal: Positive for myalgias.  Neurological: Positive for headaches.   Objective:  BP 140/84 mmHg  Pulse 97  Temp(Src) 100.7 F (38.2 C) (Oral)  Resp 17  Ht 5' 2.5" (1.588 m)  Wt 148 lb (67.132 kg)  BMI 26.62 kg/m2  SpO2 97%  Physical Exam  Constitutional: She is oriented to person, place, and time and  well-developed, well-nourished, and in no distress.  Looks like she does not feel well  HENT:  Head: Normocephalic and atraumatic.  Right Ear: Hearing, tympanic membrane, external ear and ear canal normal.  Left Ear: Hearing, tympanic membrane, external ear and ear canal normal.  Nose: Mucosal edema (red) present.  Mouth/Throat: Uvula is midline, oropharynx is clear and moist and mucous membranes are normal.  Eyes: Conjunctivae are normal.  Neck: Normal range of motion.  Cardiovascular: Normal rate, regular rhythm and normal heart sounds.   No murmur heard. Pulmonary/Chest: Effort normal and breath sounds normal.  Neurological: She is alert and oriented to person, place, and time. Gait normal.  Skin: Skin is warm and dry.  Psychiatric: Mood, memory, affect and judgment normal.  Vitals reviewed.  Results for orders placed or performed in visit on 08/17/15  POCT Influenza A/B  Result Value Ref Range   Influenza A, POC Negative Negative   Influenza B, POC Negative Negative  POCT rapid strep A  Result Value Ref Range   Rapid Strep A Screen Negative Negative    Assessment and Plan :  Sore throat - Plan: POCT rapid strep A  Flu-like symptoms - Plan: POCT Influenza A/B, oseltamivir (TAMIFLU) 75 MG capsule, Guaifenesin (MUCINEX MAXIMUM STRENGTH) 1200 MG TB12, HYDROcodone-homatropine (HYCODAN) 5-1.5 MG/5ML syrup, Care order/instruction  Cough - Plan: HYDROcodone-homatropine (HYCODAN) 5-1.5 MG/5ML syrup   Even with a neg flu test I suspect this patient has the  flu and we will treat accordingly.  Symptomatic care d/w pt.  Benny Lennert PA-C  [facility] 08/17/2015 7:03 PM

## 2015-08-17 NOTE — Patient Instructions (Signed)
Please push fluids.  Tylenol and Motrin for fever and body aches.    A humidifier can help especially when the air is dry -if you do not have a humidifier you can boil a pot of water on the stove in your home to help with the dry air.  

## 2015-09-27 ENCOUNTER — Ambulatory Visit: Payer: Medicare Other | Admitting: Family Medicine

## 2015-10-24 ENCOUNTER — Other Ambulatory Visit: Payer: Self-pay

## 2015-10-24 DIAGNOSIS — E038 Other specified hypothyroidism: Secondary | ICD-10-CM

## 2015-10-24 MED ORDER — LEVOTHYROXINE SODIUM 88 MCG PO TABS: 88.0000 ug | ORAL_TABLET | Freq: Every day | ORAL | Status: DC

## 2015-11-02 ENCOUNTER — Ambulatory Visit (INDEPENDENT_AMBULATORY_CARE_PROVIDER_SITE_OTHER): Payer: Medicare Other | Admitting: Family Medicine

## 2015-11-02 ENCOUNTER — Encounter: Payer: Self-pay | Admitting: Family Medicine

## 2015-11-02 ENCOUNTER — Ambulatory Visit (INDEPENDENT_AMBULATORY_CARE_PROVIDER_SITE_OTHER): Payer: Medicare Other

## 2015-11-02 VITALS — BP 135/76 | HR 66 | Temp 98.5°F | Resp 16 | Ht 63.0 in | Wt 149.0 lb

## 2015-11-02 DIAGNOSIS — L819 Disorder of pigmentation, unspecified: Secondary | ICD-10-CM

## 2015-11-02 DIAGNOSIS — G5603 Carpal tunnel syndrome, bilateral upper limbs: Secondary | ICD-10-CM | POA: Diagnosis not present

## 2015-11-02 DIAGNOSIS — E038 Other specified hypothyroidism: Secondary | ICD-10-CM | POA: Diagnosis not present

## 2015-11-02 DIAGNOSIS — I10 Essential (primary) hypertension: Secondary | ICD-10-CM | POA: Diagnosis not present

## 2015-11-02 DIAGNOSIS — E785 Hyperlipidemia, unspecified: Secondary | ICD-10-CM | POA: Diagnosis not present

## 2015-11-02 DIAGNOSIS — Z5181 Encounter for therapeutic drug level monitoring: Secondary | ICD-10-CM | POA: Diagnosis not present

## 2015-11-02 DIAGNOSIS — E890 Postprocedural hypothyroidism: Secondary | ICD-10-CM

## 2015-11-02 LAB — COMPREHENSIVE METABOLIC PANEL
ALBUMIN: 3.9 g/dL (ref 3.6–5.1)
ALT: 16 U/L (ref 6–29)
AST: 16 U/L (ref 10–35)
Alkaline Phosphatase: 120 U/L (ref 33–130)
BUN: 16 mg/dL (ref 7–25)
CHLORIDE: 105 mmol/L (ref 98–110)
CO2: 27 mmol/L (ref 20–31)
Calcium: 8.4 mg/dL — ABNORMAL LOW (ref 8.6–10.4)
Creat: 0.69 mg/dL (ref 0.60–0.93)
Glucose, Bld: 89 mg/dL (ref 65–99)
POTASSIUM: 3.6 mmol/L (ref 3.5–5.3)
Sodium: 142 mmol/L (ref 135–146)
TOTAL PROTEIN: 6.5 g/dL (ref 6.1–8.1)
Total Bilirubin: 0.3 mg/dL (ref 0.2–1.2)

## 2015-11-02 LAB — TSH: TSH: 1.03 m[IU]/L

## 2015-11-02 LAB — POCT SKIN KOH: SKIN KOH, POC: NEGATIVE

## 2015-11-02 MED ORDER — PREDNISONE 20 MG PO TABS: ORAL_TABLET | ORAL | Status: DC

## 2015-11-02 MED ORDER — POTASSIUM CHLORIDE CRYS ER 10 MEQ PO TBCR: EXTENDED_RELEASE_TABLET | ORAL | Status: DC

## 2015-11-02 MED ORDER — HYDROCHLOROTHIAZIDE 25 MG PO TABS
ORAL_TABLET | ORAL | Status: DC
Start: 2015-11-02 — End: 2017-01-06

## 2015-11-02 MED ORDER — DILTIAZEM HCL ER 180 MG PO CP24: 180.0000 mg | ORAL_CAPSULE | Freq: Every day | ORAL | Status: DC

## 2015-11-02 NOTE — Patient Instructions (Addendum)
We recommend that you schedule a mammogram for breast cancer screening. Typically, you do not need a referral to do this. Please contact a local imaging center to schedule your mammogram.  [facility] - [phone]  *ask for the Radiology Department [facility]) - [phone] or [phone]  [facility] - [phone] [facility] - [phone] [facility] - [phone]  *ask for the Radiology Department [facility] - [phone]  *ask for the Radiology Department [facility] - [phone]  *ask for the Mammography Department [facility] - [phone]       IF you received an x-ray today, you will receive an invoice from [facility]. Please contact [facility] at [phone] with questions or concerns regarding your invoice.   IF you received labwork today, you will receive an invoice from [employer]. Please contact [facility] at [phone] with questions or concerns regarding your invoice.   Our billing staff will not be able to assist you with questions regarding bills from these companies.  You will be contacted with the lab results as soon as they are available. The fastest way to get your results is to activate your My Chart account. Instructions are located on the last page of this paperwork. If you have not heard from [place] regarding the results in 2 weeks, please contact this office.    Carpal Tunnel Syndrome Carpal tunnel syndrome is a condition that causes pain in your hand and arm. The carpal tunnel is a narrow area located on the palm side of your wrist. Repeated wrist motion or certain diseases may cause swelling within the tunnel. This swelling pinches the main nerve in the wrist (median nerve). CAUSES  This condition may be caused by:   Repeated wrist motions.  Wrist injuries.  Arthritis.  A cyst or tumor  in the carpal tunnel.  Fluid buildup during pregnancy. Sometimes the cause of this condition is not known.  RISK FACTORS This condition is more likely to develop in:   People who have jobs that cause them to repeatedly move their wrists in the same motion, such as butchers and cashiers.  Women.  People with certain conditions, such as:  Diabetes.  Obesity.  An underactive thyroid (hypothyroidism).  Kidney failure. SYMPTOMS  Symptoms of this condition include:   A tingling feeling in your fingers, especially in your thumb, index, and middle fingers.  Tingling or numbness in your hand.  An aching feeling in your entire arm, especially when your wrist and elbow are bent for long periods of time.  Wrist pain that goes up your arm to your shoulder.  Pain that goes down into your palm or fingers.  A weak feeling in your hands. You may have trouble grabbing and holding items. Your symptoms may feel worse during the night.  DIAGNOSIS  This condition is diagnosed with a medical history and physical exam. You may also have tests, including:   An electromyogram (EMG). This test measures electrical signals sent by your nerves into the muscles.  X-rays. TREATMENT  Treatment for this condition includes:  Lifestyle changes. It is important to stop doing or modify the activity that caused your condition.  Physical or occupational therapy.  Medicines for pain and inflammation. This may include medicine that is injected into your wrist.  A wrist splint.  Surgery. HOME CARE INSTRUCTIONS  If You Have a Splint:  Wear it as told by  your health care provider. Remove it only as told by your health care provider.  Loosen the splint if your fingers become numb and tingle, or if they turn cold and blue.  Keep the splint clean and dry. General Instructions  Take over-the-counter and prescription medicines only as told by your health care provider.  Rest your wrist from any  activity that may be causing your pain. If your condition is work related, talk to your employer about changes that can be made, such as getting a wrist pad to use while typing.  If directed, apply ice to the painful area:  Put ice in a plastic bag.  Place a towel between your skin and the bag.  Leave the ice on for 20 minutes, 2-3 times per day.  Keep all follow-up visits as told by your health care provider. This is important.  Do any exercises as told by your health care provider, physical therapist, or occupational therapist. SEEK MEDICAL CARE IF:   You have new symptoms.  Your pain is not controlled with medicines.  Your symptoms get worse.   This information is not intended to replace advice given to you by your health care provider. Make sure you discuss any questions you have with your health care provider.   Document Released: 05/31/2000 Document Revised: 02/22/2015 Document Reviewed: 10/19/2014 Elsevier Interactive Patient Education 2016 Elsevier Inc.  Carpal Tunnel Release Carpal tunnel release is a surgical procedure to relieve numbness and pain in your hand that are caused by carpal tunnel syndrome. Your carpal tunnel is a narrow, hollow space in your wrist. It passes between your wrist bones and a band of connective tissue (transverse carpal ligament). The nerve that supplies most of your hand (median nerve) passes through this space, and so do the connections between your fingers and the muscles of your arm (tendons). Carpal tunnel syndrome makes this space swell and become narrow, and this causes pain and numbness. In carpal tunnel release surgery, a surgeon cuts through the transverse carpal ligament to make more room in the carpal tunnel space. You may have this surgery if other types of treatment have not worked. LET [facility] KNOW ABOUT:  Any allergies you have.  All medicines you are taking, including vitamins, herbs, eye drops, creams, and  over-the-counter medicines.  Previous problems you or members of your family have had with the use of anesthetics.  Any blood disorders you have.  Previous surgeries you have had.  Medical conditions you have. RISKS AND COMPLICATIONS Generally, this is a safe procedure. However, problems may occur, including:  Bleeding.  Infection.  Injury to the median nerve.  Need for additional surgery. BEFORE THE PROCEDURE  Ask your health care provider about:  Changing or stopping your regular medicines. This is especially important if you are taking diabetes medicines or blood thinners.  Taking medicines such as aspirin and ibuprofen. These medicines can thin your blood. Do not take these medicines before your procedure if your health care provider instructs you not to.  Do not eat or drink anything after midnight on the night before the procedure or as directed by your health care provider.  Plan to have someone take you home after the procedure. PROCEDURE  An IV tube may be inserted into a vein.  You will be given one of the following:  A medicine that numbs the wrist area (local anesthetic). You may also be given a medicine to make you relax (sedative).  A medicine that makes  you go to sleep (general anesthetic).  Your arm, hand, and wrist will be cleaned with a germ-killing solution (antiseptic).  Your surgeon will make a surgical cut (incision) over the palm side of your wrist. The surgeon will pull aside the skin of your wrist to expose the carpal tunnel space.  The surgeon will cut the transverse carpal ligament.  The edges of the incision will be closed with stitches (sutures) or staples.  A bandage (dressing) will be placed over your wrist and wrapped around your hand and wrist. AFTER THE PROCEDURE  You may spend some time in a recovery area.  Your blood pressure, heart rate, breathing rate, and blood oxygen level will be monitored often until the medicines you were  given have worn off.  You will likely have some pain. You will be given pain medicine.  You may need to wear a splint or a wrist brace over your dressing.   This information is not intended to replace advice given to you by your health care provider. Make sure you discuss any questions you have with your health care provider.   Document Released: 08/24/2003 Document Revised: 06/24/2014 Document Reviewed: 01/19/2014 Elsevier Interactive Patient Education 2016 Elsevier Inc. Carpal Tunnel Release, Care After Refer to this sheet in the next few weeks. These instructions provide you with information about caring for yourself after your procedure. Your health care provider may also give you more specific instructions. Your treatment has been planned according to current medical practices, but problems sometimes occur. Call your health care provider if you have any problems or questions after your procedure. WHAT TO EXPECT AFTER THE PROCEDURE After your procedure, it is typical to have the following:  Pain.  Numbness.  Tingling.  Swelling.  Stiffness.  Bruising. HOME CARE INSTRUCTIONS  Take medicines only as directed by your health care provider.  There are many different ways to close and cover an incision, including stitches (sutures), skin glue, and adhesive strips. Follow your health care provider's instructions about:  Incision care.  Bandage (dressing) changes and removal.  Incision closure removal.  Wear a splint or a brace as directed by your surgeon. You may need to do this for 2-3 weeks.  Keep your hand raised (elevated) above the level of your heart while you are resting. Move your fingers often.  Avoid activities that cause hand pain.  Ask your surgeon when you can start to do all of your usual activities again, such as:  Driving.  Returning to work.  Bathing and swimming.  Keep all follow-up visits as directed by your health care provider. This is important.  You may need physical therapy for several months to speed healing and regain movement. SEEK MEDICAL CARE IF:  You have drainage, redness, swelling, or pain at your incision site.  You have a fever.  You have chills.  Your pain medicine is not working.  Your symptoms do not go away after 2 months.  Your symptoms go away and then return. SEEK IMMEDIATE MEDICAL CARE IF:  You have pain or numbness that is getting worse.  Your fingers change color.  You are not able to move your fingers.   This information is not intended to replace advice given to you by your health care provider. Make sure you discuss any questions you have with your health care provider.   Document Released: 12/21/2004 Document Revised: 06/24/2014 Document Reviewed: 01/19/2014 Elsevier Interactive Patient Education [employer]

## 2015-11-02 NOTE — Progress Notes (Signed)
Subjective:    Patient ID: Isabella Berg, female    DOB: [birth year], 70 y.o.   MRN: [id number] Chief Complaint  Patient presents with  . Establish Care  . Medication Refill  . Hand Pain    right, "years"    HPI Rt>Lt hand with pain, swelling, stiffness waking her up at night crying. She has been trying rx nsaid from here, o/n bracing, change way she is sleeping.  Elevating.  The only thing helps getting up. No h/o inj - was rec to have CTS surg sev d prior.  Using her mom's arthritis pain cream.  She has felt like the braces have made it worse - she has tried several different types w/o relief.  Not checking bp outside office.  Retired. 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 Hypertension   . Thyroid disease   . Hyperlipidemia    Past Surgical History  Procedure Laterality Date  . Thyroid      Removal 2002   Current Outpatient Prescriptions on File Prior to Visit  Medication Sig Dispense Refill  . diltiazem (TIAZAC) 180 MG 24 hr capsule TAKE 1 CAPSULE (180 MG TOTAL) BY MOUTH DAILY. 90 capsule 3   No current facility-administered medications on file prior to visit.   No Known Allergies Family History  Problem Relation Age of Onset  . Hyperlipidemia Mother   . Cancer Father   . Hypertension Brother   . Stroke Brother   . Hyperlipidemia Brother    Social History   Social History  . Marital Status: Widowed    Spouse Name: N/A  . Number of Children: N/A  . Years of Education: N/A   Social History Main Topics  . Smoking status: Former Games developer  . Smokeless tobacco: None  . Alcohol Use: No  . Drug Use: No  . Sexual Activity: Not Asked   Other  Topics Concern  . None   Social History Narrative   Review of Systems  Constitutional: Negative for fever, chills, diaphoresis and appetite change.  Eyes: Negative for visual disturbance.  Respiratory: Negative for cough and shortness of breath.   Cardiovascular: Negative for chest pain, palpitations and leg swelling.  Genitourinary: Negative for decreased urine volume.  Musculoskeletal: Positive for myalgias, joint swelling and arthralgias. Negative for gait problem.  Skin: Positive for color change and wound. Negative for rash.  Neurological: Positive for weakness and numbness. Negative for syncope and headaches.  Hematological: Does not bruise/bleed easily.  Psychiatric/Behavioral: Positive for sleep disturbance. Negative for dysphoric mood. The patient is not nervous/anxious.        Objective:  \BP 135/76 mmHg  Pulse 66  Temp(Src) 98.5 F (36.9 C)  Resp 16  Ht  (1.6 m)  Wt 149 lb (67.586 kg)  BMI 26.40 kg/m2  Physical Exam  Constitutional: She is oriented to person, place, and time. She appears well-developed and well-nourished. No distress.  HENT:  Head: Normocephalic and atraumatic.  Right Ear: External ear normal.  Left Ear: External ear normal.  Eyes: Conjunctivae are normal. No scleral icterus.  Neck: Normal range of motion. Neck  supple. No thyromegaly present.  Cardiovascular: Normal rate, regular rhythm, normal heart sounds and intact distal pulses.   Pulmonary/Chest: Effort normal and breath sounds normal. No respiratory distress.  Musculoskeletal: She exhibits no edema.  Lymphadenopathy:    She has no cervical adenopathy.  Neurological: She is alert and oriented to person, place, and time.  Skin: Skin is warm and dry. Lesion noted. No bruising and no rash noted. She is not diaphoretic. No cyanosis or erythema. Nails show no clubbing.  Hardened keratonized skin on distal tip of left index finger.  Cap refill normal otherwise, non-tender  Psychiatric: She  has a normal mood and affect. Her behavior is normal.      Dg Finger Index Left  11/02/2015  CLINICAL DATA:  Severe callus at end of second finger, discoloration, initial encounter EXAM: LEFT INDEX FINGER 2+V COMPARISON:  None FINDINGS: Osseous demineralization. Joint spaces preserved. No acute fracture, dislocation, or bone destruction. IMPRESSION: No acute osseous abnormalities. Electronically Signed   By: Ulyses SouthwardMark  Boles M.D.   On: 11/02/2015 18:01       Assessment & Plan:   1. Benign essential HTN - refilled hctz, k, and dilt. stable  2. Postoperative hypothyroidism - tsh nml, refilled levothyroxine 88 mcg  3. Hyperlipidemia   4. Medication monitoring encounter   5. Bilateral carpal tunnel syndrome - failed conservative treatment with bracing, nsaids, ice, and activity modification so refer to hand surg for further testing/trx. Can try systemic prednisone taper in the meantime  6. Discoloration of skin of finger - almost like a corn at the tip of her left index finger. KOH neg and xray benign so pt reassured - will likely debride off and return to normal tissue over time.  7.     Orders Placed This Encounter  Procedures  . DG Finger Index Left    Standing Status: Future     Number of Occurrences: 1     Standing Expiration Date: 11/01/2016    Order Specific Question:  Reason for Exam (SYMPTOM  OR DIAGNOSIS REQUIRED)    Answer:  severe callus on end of 2nd finger    Order Specific Question:  Preferred imaging location?    Answer:  [facility]  . Comprehensive metabolic panel  . TSH  . Ambulatory referral to Hand Surgery    Referral Priority:  Routine    Referral Type:  Surgical    Referral Reason:  Specialty Services Required    Requested Specialty:  Hand Surgery    Number of Visits Requested:  1  . POCT Skin KOH    Meds ordered this encounter  Medications  . predniSONE (DELTASONE) 20 MG tablet    Sig: Take 3 tabs po qd x 2d, 2 tabs po qd x 2d, 1 tab x 2d.    Dispense:  12  tablet    Refill:  0  . hydrochlorothiazide (HYDRODIURIL) 25 MG tablet    Sig: TAKE 1 TABLET (25 MG TOTAL) BY MOUTH DAILY.    Dispense:  90 tablet    Refill:  3  . potassium chloride (KLOR-CON M10) 10 MEQ tablet    Sig: TAKE 1 TABLET (10 MEQ TOTAL) BY MOUTH DAILY.    Dispense:  90 tablet    Refill:  3  . diltiazem (DILACOR XR) 180 MG 24 hr capsule    Sig: Take 1 capsule (180 mg total) by mouth daily.    Dispense:  90 capsule    Refill:  3   Norberto SorensonEva Chanci Ojala, MD MPH

## 2015-11-06 ENCOUNTER — Encounter: Payer: Self-pay | Admitting: Family Medicine

## 2015-11-13 MED ORDER — LEVOTHYROXINE SODIUM 88 MCG PO TABS: 88.0000 ug | ORAL_TABLET | Freq: Every day | ORAL | Status: DC

## 2015-12-09 ENCOUNTER — Other Ambulatory Visit: Payer: Self-pay | Admitting: Physician Assistant

## 2015-12-11 ENCOUNTER — Telehealth: Payer: Self-pay

## 2015-12-11 DIAGNOSIS — E038 Other specified hypothyroidism: Secondary | ICD-10-CM

## 2015-12-11 NOTE — Telephone Encounter (Signed)
Pt is needing to know why she has been denied a refill on synthroid  Medication she will be completely out tomorrow  Best number [phone]

## 2015-12-12 MED ORDER — LEVOTHYROXINE SODIUM 88 MCG PO TABS: 88.0000 ug | ORAL_TABLET | Freq: Every day | ORAL | Status: DC

## 2015-12-12 NOTE — Telephone Encounter (Signed)
Rx sent.  Pt notified. 

## 2015-12-29 ENCOUNTER — Other Ambulatory Visit: Payer: Self-pay | Admitting: Orthopedic Surgery

## 2016-02-15 ENCOUNTER — Encounter (HOSPITAL_BASED_OUTPATIENT_CLINIC_OR_DEPARTMENT_OTHER): Admission: RE | Payer: Self-pay | Source: Ambulatory Visit

## 2016-02-15 ENCOUNTER — Ambulatory Visit (HOSPITAL_BASED_OUTPATIENT_CLINIC_OR_DEPARTMENT_OTHER): Admission: RE | Admit: 2016-02-15 | Payer: Self-pay | Source: Ambulatory Visit | Admitting: Orthopedic Surgery

## 2016-02-15 SURGERY — CARPAL TUNNEL RELEASE
Anesthesia: Choice | Laterality: Right

## 2016-06-24 ENCOUNTER — Telehealth: Payer: Self-pay | Admitting: Family Medicine

## 2016-06-24 NOTE — Telephone Encounter (Signed)
lvm advising patient to schedule medicare wellness appointment.  °

## 2016-09-25 ENCOUNTER — Telehealth: Payer: Self-pay | Admitting: Family Medicine

## 2016-09-25 NOTE — Telephone Encounter (Signed)
Left pt message asking to call Allison back directly at [phone] to schedule AWV. Thanks! °

## 2016-11-13 NOTE — Telephone Encounter (Signed)
Left pt message asking to call Allison back directly at [phone] to schedule AWV. Thanks! °

## 2016-11-27 ENCOUNTER — Other Ambulatory Visit: Payer: Self-pay | Admitting: Family Medicine

## 2016-11-28 NOTE — Telephone Encounter (Signed)
Pt is long-overdue for a f/u OV with FASTING labs. Please have her sched an appt here or with her PCP Dr. Patsy Lageropland within the next month so we can continue to make sure the medicines are keeping her healthy and safe before we refill them further. Thanks.

## 2016-12-08 ENCOUNTER — Other Ambulatory Visit: Payer: Self-pay | Admitting: Family Medicine

## 2016-12-08 DIAGNOSIS — E038 Other specified hypothyroidism: Secondary | ICD-10-CM

## 2016-12-08 DIAGNOSIS — I10 Essential (primary) hypertension: Secondary | ICD-10-CM

## 2016-12-11 ENCOUNTER — Telehealth: Payer: Self-pay | Admitting: General Practice

## 2016-12-11 ENCOUNTER — Other Ambulatory Visit: Payer: Self-pay

## 2016-12-11 DIAGNOSIS — I10 Essential (primary) hypertension: Secondary | ICD-10-CM

## 2016-12-11 MED ORDER — POTASSIUM CHLORIDE CRYS ER 10 MEQ PO TBCR: EXTENDED_RELEASE_TABLET | ORAL | 0 refills | Status: DC

## 2016-12-11 NOTE — Telephone Encounter (Signed)
Pt is checking on status of the request for levothyroxine and potassium chloride   Best number [phone]

## 2016-12-17 NOTE — Telephone Encounter (Signed)
Spoke with patient and told her that her rx was filled on 12/11/16 for 72month and to make sure that she kept her appointment for 01/06/2017 for her AWV with Dr. Clelia CroftShaw. Patient stated that if there were any cancellations from now until then to give her a call.

## 2016-12-24 ENCOUNTER — Other Ambulatory Visit: Payer: Self-pay | Admitting: Family Medicine

## 2017-01-05 NOTE — Progress Notes (Signed)
Subjective:    Patient ID: Isabella Berg, female    DOB: 24-Jun-1945, 71 y.o.   MRN: [id number] Chief Complaint  Patient presents with  . Hypertension  . Follow-up    HPI  Isabella Berg is a delightful 71 yo woman who is here for a review of her chronic disease management and medication refills.  I have met her once prior at her last visit 14 mos ago for the same.  HTN:  On hctz 25 with K 10 qd and diltiazem XR 180 qd. Does not check BP outside office. No prior EKG on chart.  Occ feels off-blanaced with position change or speinding time standing will feel like she is falling off to the side. Lab Results  Component Value Date   K 3.6 11/02/2015   K 3.7 12/12/2014   K 3.9 08/25/2013     Hypothyroidism s/p surgery: Levothyroxine 88 g dose unchanged for several years.  Lab Results  Component Value Date   TSH 1.03 11/02/2015   TSH 0.889 12/12/2014   TSH 1.224 10/06/2014   TSH 7.334 (H) 08/25/2013    HLD: LDL 143 and non-HDL 154 in 2016 drastically improved from LDL 199 with non-HDL 215 the year prior.  Is fasting.   Hypocalcemia: mildly low on labs last year. She has stopped taking any ca/vit D supp but plans to restart as doesn't get enough calcium in your diet. Vit D def:  Lab Results  Component Value Date   VD25OH 67 (L) 12/12/2014    CTS: She saw hand specilist and was scheduled for bilateral hand surgery and she also has a torn right rotator cuff and had to put off since she went back to work. She is waking up in excrutiating pain. She went to the accupunctist which helped some but now worsening - no grip strength.   Retired from a long career as a Oncologist at 71 yo, but then went back as a fulltime at [employer] last year but is not this year.   Past Medical History:  Diagnosis Date  . Hyperlipidemia   . Hypertension   . Thyroid disease    Past Surgical History:  Procedure Laterality Date  . thyroid     Removal 2002   Current Outpatient  Prescriptions on File Prior to Visit  Medication Sig Dispense Refill  . diltiazem (TIAZAC) 180 MG 24 hr capsule TAKE 1 CAPSULE (180 MG TOTAL) BY MOUTH DAILY. 90 capsule 3  . levothyroxine (SYNTHROID, LEVOTHROID) 88 MCG tablet TAKE 1 TABLET BY MOUTH EVERY DAY BEFORE BREAKFAST 30 tablet 0   No current facility-administered medications on file prior to visit.    No Known Allergies Family History  Problem Relation Age of Onset  . Hyperlipidemia Mother   . Cancer Father   . Hypertension Brother   . Stroke Brother   . Hyperlipidemia Brother    Social History   Social History  . Marital status: Widowed    Spouse name: N/A  . Number of children: N/A  . Years of education: N/A   Social History Main Topics  . Smoking status: Former Research scientist (life sciences)  . Smokeless tobacco: Never Used  . Alcohol use No  . Drug use: No  . Sexual activity: Not Asked   Other Topics Concern  . None   Social History Narrative  . None   Depression screen [facility] 2/9 01/06/2017 11/02/2015 08/17/2015 07/19/2015 06/06/2015  Decreased Interest 0 0 0 0 0  Down, Depressed, Hopeless 0 0 0  0 0  PHQ - 2 Score 0 0 0 0 0     Review of Systems See hpi    Objective:   Physical Exam  Constitutional: She is oriented to person, place, and time. She appears well-developed and well-nourished. No distress.  HENT:  Head: Normocephalic and atraumatic.  Right Ear: External ear normal.  Left Ear: External ear normal.  Eyes: Conjunctivae are normal. No scleral icterus.  Neck: Normal range of motion. Neck supple. No thyromegaly present.  Cardiovascular: Normal rate, regular rhythm, normal heart sounds and intact distal pulses.   Pulmonary/Chest: Effort normal and breath sounds normal. No respiratory distress.  Musculoskeletal: She exhibits no edema.  Lymphadenopathy:    She has no cervical adenopathy.  Neurological: She is alert and oriented to person, place, and time.  Skin: Skin is warm and dry. She is not diaphoretic. No erythema.    Psychiatric: She has a normal mood and affect. Her behavior is normal.      BP 132/76   Pulse 78   Temp 98.5 F (36.9 C) (Oral)   Resp 18   Ht 5' 2.5" (1.588 m)   Wt 151 lb 12.8 oz (68.9 kg)   SpO2 97%   BMI 27.32 kg/m      Assessment & Plan:  Last CPE 11/2014 with Dr. Lorelei Pont Tsh, cmp, vit d, lipid 1. Benign essential HTN   2. Postoperative hypothyroidism   3. Pure hypercholesterolemia   4. Hypocalcemia   5. Vitamin D deficiency   6. Medication monitoring encounter   7. Encounter for screening for malignant neoplasm of breast     Orders Placed This Encounter  Procedures  . MM DIGITAL SCREENING BILATERAL    Standing Status:   Future    Standing Expiration Date:   03/09/2018    Order Specific Question:   Reason for Exam (SYMPTOM  OR DIAGNOSIS REQUIRED)    Answer:   screening for breast cancer    Order Specific Question:   Preferred imaging location?    Answer:   [facility]  . Comprehensive metabolic panel    Order Specific Question:   Has the patient fasted?    Answer:   Yes  . Lipid panel    Order Specific Question:   Has the patient fasted?    Answer:   Yes  . TSH  . VITAMIN D 25 Hydroxy (Vit-D Deficiency, Fractures)  . Orthostatic vital signs  . Care order/instruction:    Scheduling Instructions:     Complete orders, AVS and go.  . EKG 12-Lead    Meds ordered this encounter  Medications  . diltiazem (CARDIZEM CD) 180 MG 24 hr capsule    Sig: Take 1 capsule (180 mg total) by mouth daily.    Dispense:  90 capsule    Refill:  3  . hydrochlorothiazide (HYDRODIURIL) 25 MG tablet    Sig: TAKE 1 TABLET (25 MG TOTAL) BY MOUTH DAILY.    Dispense:  90 tablet    Refill:  3  . potassium chloride (KLOR-CON M10) 10 MEQ tablet    Sig: TAKE 1 TABLET (10 MEQ TOTAL) BY MOUTH DAILY.    Dispense:  90 tablet    Refill:  3    Delman Cheadle, M.D.  Primary Care at [facility] [address] [place], [place] [address] [phone] phone [phone]  fax  01/08/17 10:06 PM

## 2017-01-06 ENCOUNTER — Encounter: Payer: Self-pay | Admitting: Family Medicine

## 2017-01-06 ENCOUNTER — Ambulatory Visit (INDEPENDENT_AMBULATORY_CARE_PROVIDER_SITE_OTHER): Payer: Medicare Other | Admitting: Family Medicine

## 2017-01-06 VITALS — BP 132/76 | HR 78 | Temp 98.5°F | Resp 18 | Ht 62.5 in | Wt 151.8 lb

## 2017-01-06 DIAGNOSIS — Z1239 Encounter for other screening for malignant neoplasm of breast: Secondary | ICD-10-CM

## 2017-01-06 DIAGNOSIS — Z1231 Encounter for screening mammogram for malignant neoplasm of breast: Secondary | ICD-10-CM | POA: Diagnosis not present

## 2017-01-06 DIAGNOSIS — E559 Vitamin D deficiency, unspecified: Secondary | ICD-10-CM

## 2017-01-06 DIAGNOSIS — E7800 Pure hypercholesterolemia, unspecified: Secondary | ICD-10-CM

## 2017-01-06 DIAGNOSIS — I10 Essential (primary) hypertension: Secondary | ICD-10-CM | POA: Diagnosis not present

## 2017-01-06 DIAGNOSIS — E890 Postprocedural hypothyroidism: Secondary | ICD-10-CM | POA: Diagnosis not present

## 2017-01-06 DIAGNOSIS — Z5181 Encounter for therapeutic drug level monitoring: Secondary | ICD-10-CM

## 2017-01-06 MED ORDER — POTASSIUM CHLORIDE CRYS ER 10 MEQ PO TBCR: EXTENDED_RELEASE_TABLET | ORAL | 3 refills | Status: DC

## 2017-01-06 MED ORDER — DILTIAZEM HCL ER COATED BEADS 180 MG PO CP24: 180.0000 mg | ORAL_CAPSULE | Freq: Every day | ORAL | 3 refills | Status: DC

## 2017-01-06 MED ORDER — HYDROCHLOROTHIAZIDE 25 MG PO TABS: ORAL_TABLET | ORAL | 3 refills | Status: DC

## 2017-01-06 NOTE — Patient Instructions (Addendum)
  Call Dr. Betha LoaKevin Kuzma Phone: [phone] to reschedule the carpal tunnel syndrome.   IF you received an x-ray today, you will receive an invoice from [facility]. Please contact [facility] at [phone] with questions or concerns regarding your invoice.   IF you received labwork today, you will receive an invoice from [facility]. Please contact [facility] at [phone] with questions or concerns regarding your invoice.   Our billing staff will not be able to assist you with questions regarding bills from these companies.  You will be contacted with the lab results as soon as they are available. The fastest way to get your results is to activate your My Chart account. Instructions are located on the last page of this paperwork. If you have not heard from us regarding the results in 2 weeks, please contact this office.     We recommend that you schedule a mammogram for breast cancer screening. Typically, you do not need a referral to do this. Please contact a local imaging center to schedule your mammogram.  [facility] - [phone]  *ask for the Radiology Department [facility] - [phone] or [phone]  [facility] - [phone] [facility] - [phone] [facility] - [phone]  *ask for the Radiology Department [facility] - [phone]  *ask for the Radiology Department [facility] - [phone]  *ask for the Mammography Department [facility] - [phone]

## 2017-01-07 LAB — LIPID PANEL
CHOL/HDL RATIO: 2.9 ratio (ref 0.0–4.4)
Cholesterol, Total: 261 mg/dL — ABNORMAL HIGH (ref 100–199)
HDL: 91 mg/dL (ref >39)
LDL CALC: 157 mg/dL — AB (ref 0–99)
Triglycerides: 64 mg/dL (ref 0–149)
VLDL Cholesterol Cal: 13 mg/dL (ref 5–40)

## 2017-01-07 LAB — VITAMIN D 25 HYDROXY (VIT D DEFICIENCY, FRACTURES): Vit D, 25-Hydroxy: 23.9 ng/mL — ABNORMAL LOW (ref 30.0–100.0)

## 2017-01-07 LAB — COMPREHENSIVE METABOLIC PANEL
A/G RATIO: 1.5 (ref 1.2–2.2)
ALBUMIN: 4.1 g/dL (ref 3.5–4.8)
ALT: 13 IU/L (ref 0–32)
AST: 12 IU/L (ref 0–40)
Alkaline Phosphatase: 151 IU/L — ABNORMAL HIGH (ref 39–117)
BUN / CREAT RATIO: 19 (ref 12–28)
BUN: 14 mg/dL (ref 8–27)
Bilirubin Total: 0.3 mg/dL (ref 0.0–1.2)
CALCIUM: 9.4 mg/dL (ref 8.7–10.3)
CO2: 25 mmol/L (ref 20–29)
CREATININE: 0.75 mg/dL (ref 0.57–1.00)
Chloride: 102 mmol/L (ref 96–106)
GFR calc Af Amer: 93 mL/min/{1.73_m2} (ref >59)
GFR, EST NON AFRICAN AMERICAN: 80 mL/min/{1.73_m2} (ref >59)
GLOBULIN, TOTAL: 2.8 g/dL (ref 1.5–4.5)
Glucose: 102 mg/dL — ABNORMAL HIGH (ref 65–99)
POTASSIUM: 3.9 mmol/L (ref 3.5–5.2)
SODIUM: 143 mmol/L (ref 134–144)
Total Protein: 6.9 g/dL (ref 6.0–8.5)

## 2017-01-07 LAB — TSH: TSH: 0.861 u[IU]/mL (ref 0.450–4.500)

## 2017-01-15 ENCOUNTER — Other Ambulatory Visit: Payer: Self-pay

## 2017-01-15 DIAGNOSIS — E038 Other specified hypothyroidism: Secondary | ICD-10-CM

## 2017-01-16 ENCOUNTER — Other Ambulatory Visit: Payer: Self-pay | Admitting: Family Medicine

## 2017-01-16 DIAGNOSIS — E038 Other specified hypothyroidism: Secondary | ICD-10-CM

## 2017-01-16 NOTE — Telephone Encounter (Signed)
Calling for refill of levothyroxine.  Labs 12/2016 for TSH were WNL.  Do you want her on the same dose?  Labs have not been released.   Please advise

## 2017-01-17 MED ORDER — LEVOTHYROXINE SODIUM 88 MCG PO TABS: ORAL_TABLET | ORAL | 3 refills | Status: DC

## 2017-02-05 ENCOUNTER — Other Ambulatory Visit: Payer: Self-pay | Admitting: Family Medicine

## 2017-02-05 DIAGNOSIS — I10 Essential (primary) hypertension: Secondary | ICD-10-CM

## 2017-05-12 ENCOUNTER — Telehealth: Payer: Self-pay

## 2017-05-12 ENCOUNTER — Telehealth: Payer: Self-pay | Admitting: Family Medicine

## 2017-05-12 DIAGNOSIS — E038 Other specified hypothyroidism: Secondary | ICD-10-CM

## 2017-05-12 MED ORDER — LEVOTHYROXINE SODIUM 88 MCG PO TABS: ORAL_TABLET | ORAL | 2 refills | Status: DC

## 2017-05-12 NOTE — Telephone Encounter (Addendum)
Received 90 day supply request  on Levothyroxine pt last received a 30dy supply with 3 refills on 01/17/2017. Next appointment is 07/07/2017

## 2017-05-12 NOTE — Telephone Encounter (Signed)
Pt already had #90 with 3 refills sent to pharm 3 mos ago - twice.  She is prob calling in for refills on wrong rx. Resent new rx for #90 w/ 2 refills.

## 2017-05-12 NOTE — Addendum Note (Signed)
Addended by: Sherren MochaSHAW, EVA N on: 05/12/2017 04:41 PM   Modules accepted: Orders

## 2017-05-27 NOTE — Telephone Encounter (Signed)
error 

## 2017-06-21 ENCOUNTER — Ambulatory Visit: Payer: Medicare Other | Admitting: Family Medicine

## 2017-06-21 ENCOUNTER — Encounter: Payer: Self-pay | Admitting: Family Medicine

## 2017-06-21 ENCOUNTER — Other Ambulatory Visit: Payer: Self-pay

## 2017-06-21 VITALS — BP 130/78 | HR 78 | Temp 98.1°F | Resp 18 | Ht 62.5 in | Wt 164.0 lb

## 2017-06-21 DIAGNOSIS — J209 Acute bronchitis, unspecified: Secondary | ICD-10-CM

## 2017-06-21 MED ORDER — AZITHROMYCIN 250 MG PO TABS: ORAL_TABLET | ORAL | 0 refills | Status: DC

## 2017-06-21 MED ORDER — HYDROCODONE-HOMATROPINE 5-1.5 MG/5ML PO SYRP: 5.0000 mL | ORAL_SOLUTION | Freq: Three times a day (TID) | ORAL | 0 refills | Status: DC | PRN

## 2017-06-21 MED ORDER — BENZONATATE 200 MG PO CAPS: 200.0000 mg | ORAL_CAPSULE | Freq: Three times a day (TID) | ORAL | 0 refills | Status: DC | PRN

## 2017-06-21 NOTE — Patient Instructions (Addendum)
Start the antibiotic azithromycin antibiotic today. You can use the benzonatate during the day at work for cough suppressant as it is non-drowsy. You can use the hydrocodone cough syrup on weekends when you are staying at home to rest or at night.     IF you received an x-ray today, you will receive an invoice from [facility]. Please contact [facility] at [phone] with questions or concerns regarding your invoice.   IF you received labwork today, you will receive an invoice from [facility]. Please contact [facility] at [phone] with questions or concerns regarding your invoice.   Our billing staff will not be able to assist you with questions regarding bills from these companies.  You will be contacted with the lab results as soon as they are available. The fastest way to get your results is to activate your My Chart account. Instructions are located on the last page of this paperwork. If you have not heard from [place] regarding the results in 2 weeks, please contact this office.     Acute Bronchitis, Adult Acute bronchitis is sudden (acute) swelling of the air tubes (bronchi) in the lungs. Acute bronchitis causes these tubes to fill with mucus, which can make it hard to breathe. It can also cause coughing or wheezing. In adults, acute bronchitis usually goes away within 2 weeks. A cough caused by bronchitis may last up to 3 weeks. Smoking, allergies, and asthma can make the condition worse. Repeated episodes of bronchitis may cause further lung problems, such as chronic obstructive pulmonary disease (COPD). What are the causes? This condition can be caused by germs and by substances that irritate the lungs, including:  Cold and flu viruses. This condition is most often caused by the same virus that causes a cold.  Bacteria.  Exposure to tobacco smoke, dust, fumes, and air pollution.  What increases the risk? This condition is more likely to develop in people  who:  Have close contact with someone with acute bronchitis.  Are exposed to lung irritants, such as tobacco smoke, dust, fumes, and vapors.  Have a weak immune system.  Have a respiratory condition such as asthma.  What are the signs or symptoms? Symptoms of this condition include:  A cough.  Coughing up clear, yellow, or green mucus.  Wheezing.  Chest congestion.  Shortness of breath.  A fever.  Body aches.  Chills.  A sore throat.  How is this diagnosed? This condition is usually diagnosed with a physical exam. During the exam, your health care provider may order tests, such as chest X-rays, to rule out other conditions. He or she may also:  Test a sample of your mucus for bacterial infection.  Check the level of oxygen in your blood. This is done to check for pneumonia.  Do a chest X-ray or lung function testing to rule out pneumonia and other conditions.  Perform blood tests.  Your health care provider will also ask about your symptoms and medical history. How is this treated? Most cases of acute bronchitis clear up over time without treatment. Your health care provider may recommend:  Drinking more fluids. Drinking more makes your mucus thinner, which may make it easier to breathe.  Taking a medicine for a fever or cough.  Taking an antibiotic medicine.  Using an inhaler to help improve shortness of breath and to control a cough.  Using a cool mist vaporizer or humidifier to make it easier to breathe.  Follow these instructions at home: Medicines  Take  over-the-counter and prescription medicines only as told by your health care provider.  If you were prescribed an antibiotic, take it as told by your health care provider. Do not stop taking the antibiotic even if you start to feel better. General instructions  Get plenty of rest.  Drink enough fluids to keep your urine clear or pale yellow.  Avoid smoking and secondhand smoke. Exposure to  cigarette smoke or irritating chemicals will make bronchitis worse. If you smoke and you need help quitting, ask your health care provider. Quitting smoking will help your lungs heal faster.  Use an inhaler, cool mist vaporizer, or humidifier as told by your health care provider.  Keep all follow-up visits as told by your health care provider. This is important. How is this prevented? To lower your risk of getting this condition again:  Wash your hands often with soap and water. If soap and water are not available, use hand sanitizer.  Avoid contact with people who have cold symptoms.  Try not to touch your hands to your mouth, nose, or eyes.  Make sure to get the flu shot every year.  Contact a health care provider if:  Your symptoms do not improve in 2 weeks of treatment. Get help right away if:  You cough up blood.  You have chest pain.  You have severe shortness of breath.  You become dehydrated.  You faint or keep feeling like you are going to faint.  You keep vomiting.  You have a severe headache.  Your fever or chills gets worse. This information is not intended to replace advice given to you by your health care provider. Make sure you discuss any questions you have with your health care provider. Document Released: 07/11/2004 Document Revised: 12/27/2015 Document Reviewed: 11/22/2015 Elsevier Interactive Patient Education  [employer]2018 [employer]

## 2017-06-21 NOTE — Progress Notes (Signed)
Subjective:  By signing my name below, I, Isabella Berg, attest that this documentation has been prepared under the direction and in the presence of Isabella SorensonEva Herschell Virani, MD Electronically Signed: Charline BillsEssence Berg, ED Scribe 06/21/2017 at 4:02 PM.   Patient ID: Isabella Berg [place], female    DOB: 03/07/1946, 72 y.o.   MRN: [id number]  Chief Complaint  Patient presents with  . Cough    patient presents to office c/o cough, loss of voice and episodes of elevated temp x 10 days. Patient denies and mucus production at this time   HPI Isabella Berg Houde is a 72 y.o. female who [facility] complaining of dry cough x 10 days. Pt states that she wakes around 2-3 AM with coughing spells. She reports associated symptoms of hoarseness over the past few days and occasional chills. She has tried cough drops, coricidin and drinking plenty of fluids. Denies sore throat, sob.  Past Medical History:  Diagnosis Date  . Hyperlipidemia   . Hypertension   . Thyroid disease    Current Outpatient Medications on File Prior to Visit  Medication Sig Dispense Refill  . diltiazem (CARDIZEM CD) 180 MG 24 hr capsule Take 1 capsule (180 mg total) by mouth daily. 90 capsule 3  . hydrochlorothiazide (HYDRODIURIL) 25 MG tablet TAKE 1 TABLET (25 MG TOTAL) BY MOUTH DAILY. 90 tablet 3  . levothyroxine (SYNTHROID, LEVOTHROID) 88 MCG tablet TAKE 1 TABLET BY MOUTH EVERY DAY BEFORE BREAKFAST 90 tablet 3  . potassium chloride (KLOR-CON M10) 10 MEQ tablet TAKE 1 TABLET (10 MEQ TOTAL) BY MOUTH DAILY. 90 tablet 3   No current facility-administered medications on file prior to visit.    No Known Allergies   Past Surgical History:  Procedure Laterality Date  . thyroid     Removal 2002   Family History  Problem Relation Age of Onset  . Hyperlipidemia Mother   . Cancer Father   . Hypertension Brother   . Stroke Brother   . Hyperlipidemia Brother    Social History   Socioeconomic History  . Marital status: Widowed      Spouse name: None  . Number of children: None  . Years of education: None  . Highest education level: None  Social Needs  . Financial resource strain: None  . Food insecurity - worry: None  . Food insecurity - inability: None  . Transportation needs - medical: None  . Transportation needs - non-medical: None  Occupational History  . None  Tobacco Use  . Smoking status: Former Games developermoker  . Smokeless tobacco: Never Used  Substance and Sexual Activity  . Alcohol use: No  . Drug use: No  . Sexual activity: None  Other Topics Concern  . None  Social History Narrative  . None   [facility] 2[facility] 06/21/2017 01/06/2017 11/02/2015 08/17/2015 07/19/2015  Decreased Interest 0 0 0 0 0  Down, Depressed, Hopeless 0 0 0 0 0  PHQ - 2 Score 0 0 0 0 0    Review of Systems  Constitutional: Positive for chills (occasional).  HENT: Positive for voice change. Negative for sore throat.   Respiratory: Positive for cough. Negative for shortness of breath.       Objective:   Physical Exam  Constitutional: She is oriented to person, place, and time. She appears well-developed and well-nourished. No distress.  HENT:  Head: Normocephalic and atraumatic.  Right Ear: Tympanic membrane normal.  Left Ear: Tympanic membrane normal.  Nose: Nose normal.  Mouth/Throat: Oropharynx is clear and moist.  Eyes: Conjunctivae and EOM are normal.  Neck: Neck supple. No tracheal deviation present. No thyromegaly present.  Cardiovascular: Normal rate.  Pulmonary/Chest: Effort normal. No respiratory distress. She has rales (few bibasilar).  Musculoskeletal: Normal range of motion.  Lymphadenopathy:       Head (right side): Submandibular adenopathy present.       Head (left side): Submandibular adenopathy present.    She has no cervical adenopathy.  Neurological: She is alert and oriented to person, place, and time.  Skin: Skin is warm and dry.  Psychiatric: She has a normal mood and affect. Her behavior is  normal.  Nursing note and vitals reviewed.  BP 130/78 (BP Location: Left Arm, Patient Position: Sitting, Cuff Size: Large)   Pulse 78   Temp 98.1 F (36.7 C) (Oral)   Resp 18   Ht 5' 2.5" (1.588 m)   Wt 164 lb (74.4 kg)   SpO2 99%   BMI 29.52 kg/m     Assessment & Plan:   1. Acute bronchitis, unspecified organism     Meds ordered this encounter  Medications  . azithromycin (ZITHROMAX) 250 MG tablet    Sig: Take 2 tabs PO x 1 dose, then 1 tab PO QD x 4 days    Dispense:  6 tablet    Refill:  0  . HYDROcodone-homatropine (HYCODAN) 5-1.5 MG/5ML syrup    Sig: Take 5 mLs by mouth every 8 (eight) hours as needed for cough.    Dispense:  120 mL    Refill:  0  . benzonatate (TESSALON) 200 MG capsule    Sig: Take 1 capsule (200 mg total) by mouth 3 (three) times daily as needed for cough.    Dispense:  40 capsule    Refill:  0    I personally performed the services described in this documentation, which was scribed in my presence. The recorded information has been reviewed and considered, and addended by me as needed.   Isabella Berg, M.D.  Primary Care at [facility] [address] [place], [place] [address] [phone] phone [phone] fax  06/24/17 12:24 PM

## 2017-07-07 ENCOUNTER — Other Ambulatory Visit: Payer: Self-pay

## 2017-07-07 ENCOUNTER — Ambulatory Visit (INDEPENDENT_AMBULATORY_CARE_PROVIDER_SITE_OTHER): Payer: Medicare Other | Admitting: Family Medicine

## 2017-07-07 ENCOUNTER — Encounter: Payer: Self-pay | Admitting: Family Medicine

## 2017-07-07 ENCOUNTER — Encounter: Payer: Medicare Other | Admitting: Family Medicine

## 2017-07-07 VITALS — BP 138/80 | HR 81 | Temp 98.6°F | Resp 16 | Ht 62.25 in | Wt 161.0 lb

## 2017-07-07 DIAGNOSIS — E890 Postprocedural hypothyroidism: Secondary | ICD-10-CM

## 2017-07-07 DIAGNOSIS — Z0000 Encounter for general adult medical examination without abnormal findings: Secondary | ICD-10-CM | POA: Diagnosis not present

## 2017-07-07 DIAGNOSIS — Z1211 Encounter for screening for malignant neoplasm of colon: Secondary | ICD-10-CM | POA: Diagnosis not present

## 2017-07-07 DIAGNOSIS — Z1389 Encounter for screening for other disorder: Secondary | ICD-10-CM

## 2017-07-07 DIAGNOSIS — M8580 Other specified disorders of bone density and structure, unspecified site: Secondary | ICD-10-CM

## 2017-07-07 DIAGNOSIS — E663 Overweight: Secondary | ICD-10-CM

## 2017-07-07 DIAGNOSIS — E7800 Pure hypercholesterolemia, unspecified: Secondary | ICD-10-CM | POA: Diagnosis not present

## 2017-07-07 DIAGNOSIS — G5603 Carpal tunnel syndrome, bilateral upper limbs: Secondary | ICD-10-CM

## 2017-07-07 DIAGNOSIS — I10 Essential (primary) hypertension: Secondary | ICD-10-CM

## 2017-07-07 DIAGNOSIS — Z136 Encounter for screening for cardiovascular disorders: Secondary | ICD-10-CM

## 2017-07-07 DIAGNOSIS — R7303 Prediabetes: Secondary | ICD-10-CM

## 2017-07-07 DIAGNOSIS — Z1231 Encounter for screening mammogram for malignant neoplasm of breast: Secondary | ICD-10-CM

## 2017-07-07 DIAGNOSIS — Z113 Encounter for screening for infections with a predominantly sexual mode of transmission: Secondary | ICD-10-CM | POA: Diagnosis not present

## 2017-07-07 DIAGNOSIS — Z1383 Encounter for screening for respiratory disorder NEC: Secondary | ICD-10-CM | POA: Diagnosis not present

## 2017-07-07 DIAGNOSIS — E559 Vitamin D deficiency, unspecified: Secondary | ICD-10-CM | POA: Diagnosis not present

## 2017-07-07 DIAGNOSIS — Z1212 Encounter for screening for malignant neoplasm of rectum: Secondary | ICD-10-CM

## 2017-07-07 DIAGNOSIS — Z5181 Encounter for therapeutic drug level monitoring: Secondary | ICD-10-CM

## 2017-07-07 LAB — POCT URINALYSIS DIP (MANUAL ENTRY)
BILIRUBIN UA: NEGATIVE mg/dL
Bilirubin, UA: NEGATIVE
Blood, UA: NEGATIVE
GLUCOSE UA: NEGATIVE mg/dL
Nitrite, UA: NEGATIVE
Protein Ur, POC: NEGATIVE mg/dL
SPEC GRAV UA: 1.02 (ref 1.010–1.025)
Urobilinogen, UA: 0.2 E.U./dL
pH, UA: 7 (ref 5.0–8.0)

## 2017-07-07 NOTE — Patient Instructions (Addendum)
Start 2000u a day for vitamin D.  Make sure you are taking a daily calcium/vitamin D supplement . Try to find a chewable calcium supplement with 400 to 600 mg of calcium in it and as much vitamin D as you can.  Take this at least once a day, and not with other calcium sources for maximum absorption.   You need to schedule your mammogram and your DEXA bone scan.  Please call [facility] at [phone] to schedule.   IF you received an x-ray today, you will receive an invoice from [facility]. Please contact [facility] at [phone] with questions or concerns regarding your invoice.   IF you received labwork today, you will receive an invoice from [place]. Please contact [facility] at [phone] with questions or concerns regarding your invoice.   Our billing staff will not be able to assist you with questions regarding bills from these companies.  You will be contacted with the lab results as soon as they are available. The fastest way to get your results is to activate your My Chart account. Instructions are located on the last page of this paperwork. If you have not heard from [place] regarding the results in 2 weeks, please contact this office.     Shin Splints Shin splints is a painful condition that is felt on the bone that is located in the front of the lower leg (tibia or shin bone) or in the muscles on either side of the bone. This condition happens when physical activities lead to inflammation of the muscles, tendons, and the thin layer that covers the shin bone. It may result from participating in sports or other demanding exercise. What are the causes? This condition may be caused by:  Overuse of muscles.  Repetitive activities.  Flat feet or rigid arches.  Activities that could contribute to shin splints include:  Having a sudden increase in exercise time.  Starting a new, demanding activity.  Running up hills or long  distances.  Playing sports that involve sudden starts and stops.  Using a poor warm-up.  Wearing old or worn-out shoes.  What are the signs or symptoms? Symptoms of this condition include:  Pain on the front of the lower leg.  Pain while exercising or at rest.  How is this diagnosed? This condition may be diagnosed based on a physical exam and the history of your symptoms. Your health care provider may observe you as you walk or run. You may also have X-rays or other imaging tests to rule out other problems that could cause lower leg pain, such as a stress fracture. How is this treated? Treatment for this condition may depend on your age, history, health, and how bad the pain is. Most cases of shin splints can be managed by doing one or more of the following:  Resting.  Reducing the length and intensity of your exercise.  Stopping the activity that causes shin pain.  Taking medicines to control the inflammation.  Icing, massaging, stretching, and strengthening the affected area.  Wearing shoes that have rigid heels, shock absorption, and a good arch support.  Follow these instructions at home: Injury care  If directed, apply ice to the injured area: ? Put ice in a plastic bag. ? Place a towel between your skin and the bag. ? Leave the ice on for 20 minutes, 2-3 times a day.  Massage, stretch, and strengthen the affected area as directed by your health care provider. Activity  Rest  as needed. Return to activity gradually as directed by your health care provider.  Restart your exercise sessions with non-weight-bearing exercises, such as cycling or swimming.  Stop running if the pain returns.  Warm up properly before exercising.  Run on a surface that is level and fairly firm.  Gradually change the intensity of an exercise.  If you increase your running distance, add only 5-10% to your distance each week. This means that if you are running 5 miles this week, you  should only increase your run by - mile for next week. General instructions  Take medicines only as directed by your health care provider.  Wear shoes that have rigid heels, shock absorption, and a good arch support.  Change your athletic shoes every 6 months, or every 350-450 miles. Contact a health care provider if:  Your symptoms continue or they worsen even after treatment.  The location, intensity, or type of pain changes over time.  You have swelling in your lower leg that gets worse.  Your shin becomes red and feels warm. Get help right away if:  You have severe pain.  You have trouble walking. This information is not intended to replace advice given to you by your health care provider. Make sure you discuss any questions you have with your health care provider. Document Released: 05/31/2000 Document Revised: 01/27/2016 Document Reviewed: 05/30/2014 Elsevier Interactive Patient Education  2018 [employer].   [place] Splints [facility]hab Ask your health care provider which exercises are safe for you. Do exercises exactly as told by your health care provider and adjust them as directed. It is normal to feel mild stretching, pulling, tightness, or discomfort as you do these exercises, but you should stop right away if you feel sudden pain or your pain gets worse.Do not begin these exercises until told by your health care provider. Stretching and range of motion exercise This exercise warms up your muscles and joints and improves the movement and flexibility of your lower leg. This exercise also helps to relieve pain, numbness, and tingling. Exercise A: Calf stretch, standing  1. Stand with the ball of your left / right foot on a step. The ball of your foot is on the walking surface, right under your toes. 2. Keep your other foot firmly on the same step. 3. Hold onto the wall, a railing, or a chair for balance. 4. Slowly lift your other foot, allowing your body weight to press your  left / right heel down over the edge of the step. You should feel a stretch in your left / right calf. 5. Hold this position for __________ seconds. 6. Repeat this exercise with a slight bend in your left / right knee. Repeat __________ times with your left / right knee straight and __________ times with your left / right knee bent. Complete this exercise __________ times a day. Strengthening exercises These exercises build strength and endurance in your lower leg. Endurance is the ability to use your muscles for a long time, even after they get tired. Exercise B: Dorsiflexion  1. Secure a rubber exercise band or tubing to a fixed object, such as a table leg or a pole. 2. Secure the other end of the band around your left / right foot. 3. Sit on the floor, facing the fixed object. The band should be slightly tense when your foot is relaxed. 4. Slowly use your ankle muscles to pull your foot toward you. 5. Hold this position for __________ seconds. 6. Slowly release  the tension in the band and return your foot to the starting position. Repeat __________ times. Complete this exercise __________ times a day. Exercise C: Ankle eversion with band 1. Secure one end of a rubber exercise band or tubing to a fixed object, such as a table leg or a pole, that will stay in place when the band is pulled. 2. Loop the other end of the band around the middle of your left / right foot. 3. Sit on the floor, facing the fixed object. The band should be slightly tense when your foot is relaxed. 4. Make fists with your hands and put them between your knees. This will focus your strengthening at your ankle. 5. Leading with your little toe, slowly push your banded foot outward, away from your other leg. Make sure the band is positioned to resist the entire motion. 6. Hold this position for __________ seconds. 7. Control the tension in the band as you slowly return your foot to the starting position. Repeat __________  times. Complete this exercise __________ times a day. Exercise D: Ankle inversion with band 1. Secure one end of a rubber exercise band or tubing to a fixed object, such as a table leg or a pole, that will stay still when the band is pulled. 2. Loop the other end of the band around your left / right foot, just below your toes. 3. Sit on the floor, facing the fixed object. The band should be slightly tense when your foot is relaxed. 4. Make fists with your hands and put them between your knees. This will focus your strengthening at your ankle. 5. Leading with your big toe, slowly pull your banded foot inward, toward your other leg. Make sure the band is positioned to resist the entire motion. 6. Hold this position for __________ seconds. 7. Control the tension in the band as you slowly return your foot to the starting position. Repeat __________ times. Complete this exercise __________ times a day. Exercise E: Lateral walking with band 1. Stand in a long hallway. 2. Wrap a loop of exercise band around your legs, just above your knees. 3. Bend your knees gently and drop your hips down and back so your weight is over your heels. 4. Step to the side to move down the length of the hallway, keeping your toes pointed forward and keeping tension in the band. 5. Repeat, leading with your other leg. Repeat __________ times. Complete this exercise __________ times a day. Balance exercise This exercise will help improve your control of your foot and ankle when you are standing or walking. Exercise F: Single leg stand 1. Without wearing shoes, stand near a railing or in a doorway. You may hold onto the railing or door frame as needed. 2. Stand on your left / right foot. Keep your big toe down on the floor and try to keep your arch lifted. 3. If this exercise is too easy, you can try doing it with your eyes closed or while standing on a pillow. 4. Hold this position for __________ seconds. Repeat  __________ times. Complete this exercise __________ times a day. This information is not intended to replace advice given to you by your health care provider. Make sure you discuss any questions you have with your health care provider. Document Released: 06/03/2005 Document Revised: 02/06/2016 Document Reviewed: 03/02/2015 Elsevier Interactive Patient Education  2018 [employer].   Bone Health Bones protect organs, store calcium, and anchor muscles. Good health habits, such as eating  nutritious foods and exercising regularly, are important for maintaining healthy bones. They can also help to prevent a condition that causes bones to lose density and become weak and brittle (osteoporosis). Why is bone mass important? Bone mass refers to the amount of bone tissue that you have. The higher your bone mass, the stronger your bones. An important step toward having healthy bones throughout life is to have strong and dense bones during childhood. A young adult who has a high bone mass is more likely to have a high bone mass later in life. Bone mass at its greatest it is called peak bone mass. A large decline in bone mass occurs in older adults. In women, it occurs about the time of menopause. During this time, it is important to practice good health habits, because if more bone is lost than what is replaced, the bones will become less healthy and more likely to break (fracture). If you find that you have a low bone mass, you may be able to prevent osteoporosis or further bone loss by changing your diet and lifestyle. How can I find out if my bone mass is low? Bone mass can be measured with an X-ray test that is called a bone mineral density (BMD) test. This test is recommended for all women who are age 215 or older. It may also be recommended for men who are age 33 or older, or for people who are more likely to develop osteoporosis due to:  Having bones that break easily.  Having a long-term disease that  weakens bones, such as kidney disease or rheumatoid arthritis.  Having menopause earlier than normal.  Taking medicine that weakens bones, such as steroids, thyroid hormones, or hormone treatment for breast cancer or prostate cancer.  Smoking.  Drinking three or more alcoholic drinks each day.  What are the nutritional recommendations for healthy bones? To have healthy bones, you need to get enough of the right minerals and vitamins. Most nutrition experts recommend getting these nutrients from the foods that you eat. Nutritional recommendations vary from person to person. Ask your health care provider what is healthy for you. Here are some general guidelines. Calcium Recommendations Calcium is the most important (essential) mineral for bone health. Most people can get enough calcium from their diet, but supplements may be recommended for people who are at risk for osteoporosis. Good sources of calcium include:  Dairy products, such as low-fat or nonfat milk, cheese, and yogurt.  Dark green leafy vegetables, such as bok choy and broccoli.  Calcium-fortified foods, such as orange juice, cereal, bread, soy beverages, and tofu products.  Nuts, such as almonds.  Follow these recommended amounts for daily calcium intake:  Children, age 21?3: 700 mg.  Children, age 78?8: 1,000 mg.  Children, age 77?13: 1,300 mg.  Teens, age 214?18: 1,300 mg.  Adults, age 219?50: 1,000 mg.  Adults, age 74?70: ? Men: 1,000 mg. ? Women: 1,200 mg.  Adults, age 62 or older: 1,200 mg.  Pregnant and breastfeeding females: ? Teens: 1,300 mg. ? Adults: 1,000 mg.  Vitamin D Recommendations Vitamin D is the most essential vitamin for bone health. It helps the body to absorb calcium. Sunlight stimulates the skin to make vitamin D, so be sure to get enough sunlight. If you live in a cold climate or you do not get outside often, your health care provider may recommend that you take vitamin D supplements. Good  sources of vitamin D in your diet include:  Egg yolks.  Saltwater fish.  Milk and cereal fortified with vitamin D.  Follow these recommended amounts for daily vitamin D intake:  Children and teens, age 50?18: 600 international units.  Adults, age 73 or younger: 400-800 international units.  Adults, age 851 or older: 800-1,000 international units.  Other Nutrients Other nutrients for bone health include:  Phosphorus. This mineral is found in meat, poultry, dairy foods, nuts, and legumes. The recommended daily intake for adult men and adult women is 700 mg.  Magnesium. This mineral is found in seeds, nuts, dark green vegetables, and legumes. The recommended daily intake for adult men is 400?420 mg. For adult women, it is 310?320 mg.  Vitamin K. This vitamin is found in green leafy vegetables. The recommended daily intake is 120 mg for adult men and 90 mg for adult women.  What type of physical activity is best for building and maintaining healthy bones? Weight-bearing and strength-building activities are important for building and maintaining peak bone mass. Weight-bearing activities cause muscles and bones to work against gravity. Strength-building activities increases muscle strength that supports bones. Weight-bearing and muscle-building activities include:  Walking and hiking.  Jogging and running.  Dancing.  Gym exercises.  Lifting weights.  Tennis and racquetball.  Climbing stairs.  Aerobics.  Adults should get at least 30 minutes of moderate physical activity on most days. Children should get at least 60 minutes of moderate physical activity on most days. Ask your health care provide what type of exercise is best for you. Where can I find more information? For more information, check out the following websites:  National Osteoporosis Foundation: http://burton-owens.org/http://nof.org/learn/basics  [employer] Health:  http://www.niams.http://www.johnson-fowler.biz/nih.gov/Health_Info/Bone/Bone_Health/bone_health_for_life.asp  This information is not intended to replace advice given to you by your health care provider. Make sure you discuss any questions you have with your health care provider. Document Released: 08/24/2003 Document Revised: 12/22/2015 Document Reviewed: 06/08/2014 Elsevier Interactive Patient Education  2018 [employer] Inc[employer]  Calcium Intake Recommendations Calcium is a mineral that affects many functions in the body, including:  Blood clotting.  Blood vessel function.  Nerve impulse conduction.  Hormone secretion.  Muscle contraction.  Bone and teeth functions.  Most of your body's calcium supply is stored in your bones and teeth. When your calcium stores are low, you may be at risk for low bone mass, bone loss, and bone fractures. Consuming enough calcium helps to grow healthy bones and teeth and to prevent breakdown over time. It is very important that you get enough calcium if you are:  A child undergoing rapid growth.  An adolescent girl.  A pre- or post-menopausal woman.  A woman whose menstrual cycle has stopped due to anorexia nervosa or regular intense exercise.  An individual with lactose intolerance or a milk allergy.  A vegetarian.  What is my plan? Try to consume the recommended amount of calcium daily based on your age. Depending on your overall health, your health care provider may recommend increased calcium intake.General daily calcium intake recommendations by age are:  Birth to 6 months: 200 mg.  Infants 7 to 12 months: 260 mg.  Children 1 to 3 years: 700 mg.  Children 4 to 8 years: 1,000 mg.  Children 9 to 13 years: 1,300 mg.  Teens 14 to 18 years: 1,300 mg.  Adults 19 to 50 years: 1,000 mg.  Adult women 51 to 70 years: 1,200 mg.  Adult men 51 to 70 years: 1,000 mg.  Adults 71 years and older: 1,200 mg.  Pregnant and breastfeeding  teens: 1,300 mg.  Pregnant and  breastfeeding adults: 1,000 mg.  What do I need to know about calcium intake?  In order for the body to absorb calcium, it needs vitamin D. You can get vitamin D through: ? Direct exposure of the skin to sunlight. ? Foods, such as egg yolks, liver, saltwater fish, and fortified milk. ? Supplements.  Consuming too much calcium may cause: ? Constipation. ? Decreased absorption of iron and zinc. ? Kidney stones.  Calcium supplements may interact with certain medicines. Check with your health care provider before starting any calcium supplements.  Try to get most of your calcium from food. What foods can I eat? Grains  Fortified oatmeal. Fortified ready-to-eat cereals. Fortified frozen waffles. Vegetables Turnip greens. Broccoli. Fruits Fortified orange juice. Meats and Other Protein Sources Canned sardines with bones. Canned salmon with bones. Soy beans. Tofu. Baked beans. Almonds. [place] nuts. Sunflower seeds. Dairy Milk. Yogurt. Cheese. Cottage cheese. Beverages Fortified soy milk. Fortified rice milk. Sweets/Desserts Pudding. Ice Cream. Milkshakes. Blackstrap molasses. The items listed above may not be a complete list of recommended foods or beverages. Contact your dietitian for more options. What foods can affect my calcium intake? It may be more difficult for your body to use calcium or calcium may leave your body more quickly if you consume large amounts of:  Sodium.  Protein.  Caffeine.  Alcohol.  This information is not intended to replace advice given to you by your health care provider. Make sure you discuss any questions you have with your health care provider. Document Released: 01/16/2004 Document Revised: 12/22/2015 Document Reviewed: 11/09/2013 Elsevier Interactive Patient Education  2018 [employer].

## 2017-07-07 NOTE — Progress Notes (Addendum)
Subjective:  By signing my name below, I, [name], attest that this documentation has been prepared under the direction and in the presence of Norberto Sorenson, MD Electronically Signed: Charline Bills, ED Scribe 07/07/2017 at 4:17 PM.   Patient ID: Isabella Berg, female    DOB: 1945-12-15, 72 y.o.   MRN: [id number]  Chief Complaint  Patient presents with  . Annual Exam   HPI Isabella Berg is a 72 y.o. female who presents to Primary Care at [facility] for an annual exam. Pt is fasting at this visit; last meal at 5 PM yesterday.  Primary Preventative Screenings: Cervical Cancer:  NA due to age STI screening: declines need, ok w/ hep c screen today Breast Cancer: due; last mammogram in 2011 Colorectal Cancer: due; last colonoscopy in 2006 without polyps. Would like to do the cologuard. Tobacco use/EtOH/substances:  Bone Density: h/o osteopenia Cardiac: baseline EKG today - ordered and says completed at last visit 12/2016 but no link under order, no strip scanned in, no read in my note and pt does not recall it being done at that time Weight/Blood sugar/Diet/Exercise: BMI Readings from Last 3 Encounters:  07/07/17 29.21 kg/m  06/21/17 29.52 kg/m  01/06/17 27.32 kg/m   No results found for: HGBA1C OTC/Vit/Supp/Herbal: Not taking Vit D or Calcium supplements. Does not drink milk but occasionally eats yogurt. Denies indigestion/heartburn or meds for GERD. Dentist/Optho: annually to both, wears glasses Immunizations:  Immunization History  Administered Date(s) Administered  . Td 04/07/2015  Pneumonia vaccines: unknown; declines Shingles vaccine: declines   Shin Splints Reports chronic intermittent shin pain at the end of the day after work. Has been wearing a lot lately.  Hand Pain/Swelling Pt states that she is still experiencing R hand pain and swelling with lying down. She describes pain as a tight, numb/tingling sensation that radiates from the fingers to the elbow in the  mornings and during the night. Pt has also noticed swelling to the R hand. She has tried running hot water over her hand with temporary relief. Denies neck pain. She saw hand surgery who scheduled her for bilateral hand surgery but pt tore her rotator cuff. She saw an acupuncturist to put off carpal tunnel surgery which improved pain but had to stop due to insurance and horror stories from friends/famaily who had surgery without improvement of pain. Pt would like to return to see acupuncturist again. She has tried topical inflammatories and Tylenol without relief, however, she has never had an injection.  Past Medical History:  Diagnosis Date  . Hyperlipidemia   . Hypertension   . Thyroid disease    Current Outpatient Medications on File Prior to Visit  Medication Sig Dispense Refill  . diltiazem (CARDIZEM CD) 180 MG 24 hr capsule Take 1 capsule (180 mg total) by mouth daily. 90 capsule 3  . hydrochlorothiazide (HYDRODIURIL) 25 MG tablet TAKE 1 TABLET (25 MG TOTAL) BY MOUTH DAILY. 90 tablet 3  . levothyroxine (SYNTHROID, LEVOTHROID) 88 MCG tablet TAKE 1 TABLET BY MOUTH EVERY DAY BEFORE BREAKFAST 90 tablet 3  . potassium chloride (KLOR-CON M10) 10 MEQ tablet TAKE 1 TABLET (10 MEQ TOTAL) BY MOUTH DAILY. 90 tablet 3   No current facility-administered medications on file prior to visit.    No Known Allergies   Past Surgical History:  Procedure Laterality Date  . thyroid     Removal 2002   Family History  Problem Relation Age of Onset  . Hyperlipidemia Mother   . Cancer Father   .  Hypertension Brother   . Stroke Brother   . Hyperlipidemia Brother    Social History   Socioeconomic History  . Marital status: Widowed    Spouse name: None  . Number of children: None  . Years of education: None  . Highest education level: None  Social Needs  . Financial resource strain: None  . Food insecurity - worry: None  . Food insecurity - inability: None  . Transportation needs - medical: None   . Transportation needs - non-medical: None  Occupational History  . None  Tobacco Use  . Smoking status: Former Games developermoker  . Smokeless tobacco: Never Used  Substance and Sexual Activity  . Alcohol use: No  . Drug use: No  . Sexual activity: None  Other Topics Concern  . None  Social History Narrative  . None   [facility] 07/07/2017 06/21/2017 01/06/2017 11/02/2015 08/17/2015  Decreased Interest 0 0 0 0 0  Down, Depressed, Hopeless 0 0 0 0 0  PHQ - 2 Score 0 0 0 0 0    Review of Systems  HENT: Positive for dental problem.   Musculoskeletal: Positive for arthralgias, back pain (lower), joint swelling and myalgias. Negative for neck pain.  Neurological: Positive for weakness (hands) and numbness (hands).  All other systems reviewed and are negative.     Objective:   Physical Exam  Constitutional: She is oriented to person, place, and time. She appears well-developed and well-nourished. No distress.  HENT:  Head: Normocephalic and atraumatic.  Right Ear: Tympanic membrane normal.  Left Ear: Tympanic membrane normal.  Nose: Nose normal.  Mouth/Throat: Oropharynx is clear and moist and mucous membranes are normal.  Eyes: Conjunctivae and EOM are normal.  Neck: Neck supple. No tracheal deviation present.  Cardiovascular: Normal rate.  Pulmonary/Chest: Effort normal. No respiratory distress.  Musculoskeletal: Normal range of motion.  Lymphadenopathy:    She has no cervical adenopathy.  Neurological: She is alert and oriented to person, place, and time.  Skin: Skin is warm and dry.  Psychiatric: She has a normal mood and affect. Her behavior is normal.  Nursing note and vitals reviewed.  BP 138/80 (BP Location: Left Arm, Patient Position: Sitting, Cuff Size: Large)   Pulse 81   Temp 98.6 F (37 C) (Oral)   Resp 16   Ht 5' 2.25" (1.581 m)   Wt 161 lb (73 kg)   SpO2 97%   BMI 29.21 kg/m      Visual Acuity Screening   Right eye Left eye Both eyes  Without  correction:     With correction: 20/25 20/40 20/20     EKG: NSR, no acute ischemic changes noted. No prior EKG available for comparison.   I have personally reviewed the EKG tracing and agree with the computer interpretation of Sinus Rhythm WITHIN NORMAL LIMITS. Assessment & Plan:   1. Annual physical exam   2. Screening for cardiovascular, respiratory, and genitourinary diseases   3. Screening for colorectal cancer   4. Routine screening for STI (sexually transmitted infection)   5. Encounter for screening mammogram for breast cancer   6. Vitamin D deficiency   7. Pure hypercholesterolemia - about the same as prior - LDL ~150 but HDL high ~90 with non-HDL 295163.  ASCVD risk 19.7% up from 17.8% at visit 6 mos prior with optimal 8.8% - calculator estimates could be lowered by ~4% by starting statin and another ~4% by optimizing BP meds.  Not on any meds but rec starting  statin  8. Postoperative hypothyroidism - stable on levothyroxine 88 mcg qd  9. Benign essential HTN - cont dilt CD 180 qd, hctz 25, and K 10. Recheck in 6 mos for additional refills.  10. Medication monitoring encounter   11. Overweight (BMI 25.0-29.9)   12. Osteopenia, unspecified location   Bilateral Carpal Tunnel Syndrome - severe, keeping her from sleep at night - rec to have surgery years prev but doesn't want to proceed due to the poor outcomes she has heard from others who went through it. Has tried mult braces but can't tolerate due to pain. Has never had injection prior. Went to acupuncture once which DID help but was expensive as not covered by insurance and she didn't f/u like she was supposed to but thinks she would like to retry acupuncture again and be more committed. I will write a letter to her insurance asking that they cover this recommended medical therapy for pt. If this is not successful, she will consider seeing a hand surgeon again to get more info about surg and try cortisone inj.  Orders Placed This  Encounter  Procedures  . MM Digital Screening    Standing Status:   Future    Standing Expiration Date:   09/05/2018    Order Specific Question:   Reason for Exam (SYMPTOM  OR DIAGNOSIS REQUIRED)    Answer:   screening    Order Specific Question:   Preferred imaging location?    Answer:   [facility]  . DG Bone Density    Standing Status:   Future    Standing Expiration Date:   09/05/2018    Order Specific Question:   Reason for Exam (SYMPTOM  OR DIAGNOSIS REQUIRED)    Answer:   f/u osteopenia dx'd in 2013    Order Specific Question:   Preferred imaging location?    Answer:   [facility]  . Lipid panel    Order Specific Question:   Has the patient fasted?    Answer:   Yes  . CBC with Differential/Platelet  . Comprehensive metabolic panel    Order Specific Question:   Has the patient fasted?    Answer:   Yes  . TSH  . VITAMIN D 25 Hydroxy (Vit-D Deficiency, Fractures)  . HCV Ab w/Rflx to Verification  . Hemoglobin A1c  . Cologuard  . Interpretation:  . POCT urinalysis dipstick  . EKG 12-Lead     I personally performed the services described in this documentation, which was scribed in my presence. The recorded information has been reviewed and considered, and addended by me as needed.   Norberto Sorenson, M.D.  Primary Care at [facility] [address] [place], [place] [address] [phone] phone [phone] fax  07/09/17 1:41 AM

## 2017-07-08 LAB — COMPREHENSIVE METABOLIC PANEL
ALBUMIN: 4.2 g/dL (ref 3.5–4.8)
ALK PHOS: 162 IU/L — AB (ref 39–117)
ALT: 14 IU/L (ref 0–32)
AST: 16 IU/L (ref 0–40)
Albumin/Globulin Ratio: 1.4 (ref 1.2–2.2)
BILIRUBIN TOTAL: 0.4 mg/dL (ref 0.0–1.2)
BUN / CREAT RATIO: 12 (ref 12–28)
BUN: 9 mg/dL (ref 8–27)
CHLORIDE: 99 mmol/L (ref 96–106)
CO2: 28 mmol/L (ref 20–29)
Calcium: 9.4 mg/dL (ref 8.7–10.3)
Creatinine, Ser: 0.74 mg/dL (ref 0.57–1.00)
GFR calc Af Amer: 94 mL/min/{1.73_m2} (ref >59)
GFR calc non Af Amer: 81 mL/min/{1.73_m2} (ref >59)
GLUCOSE: 96 mg/dL (ref 65–99)
Globulin, Total: 3.1 g/dL (ref 1.5–4.5)
Potassium: 3.5 mmol/L (ref 3.5–5.2)
Sodium: 141 mmol/L (ref 134–144)
Total Protein: 7.3 g/dL (ref 6.0–8.5)

## 2017-07-08 LAB — CBC WITH DIFFERENTIAL/PLATELET
BASOS ABS: 0 10*3/uL (ref 0.0–0.2)
Basos: 1 %
EOS (ABSOLUTE): 0.1 10*3/uL (ref 0.0–0.4)
Eos: 2 %
Hematocrit: 43.4 % (ref 34.0–46.6)
Hemoglobin: 14.9 g/dL (ref 11.1–15.9)
Immature Grans (Abs): 0 10*3/uL (ref 0.0–0.1)
Immature Granulocytes: 0 %
LYMPHS ABS: 1.6 10*3/uL (ref 0.7–3.1)
Lymphs: 40 %
MCH: 28.1 pg (ref 26.6–33.0)
MCHC: 34.3 g/dL (ref 31.5–35.7)
MCV: 82 fL (ref 79–97)
MONOS ABS: 0.3 10*3/uL (ref 0.1–0.9)
Monocytes: 9 %
Neutrophils Absolute: 2 10*3/uL (ref 1.4–7.0)
Neutrophils: 48 %
PLATELETS: 308 10*3/uL (ref 150–379)
RBC: 5.3 x10E6/uL — ABNORMAL HIGH (ref 3.77–5.28)
RDW: 14.6 % (ref 12.3–15.4)
WBC: 4 10*3/uL (ref 3.4–10.8)

## 2017-07-08 LAB — HEMOGLOBIN A1C
Est. average glucose Bld gHb Est-mCnc: 126 mg/dL
Hgb A1c MFr Bld: 6 % — ABNORMAL HIGH (ref 4.8–5.6)

## 2017-07-08 LAB — LIPID PANEL
CHOL/HDL RATIO: 2.9 ratio (ref 0.0–4.4)
Cholesterol, Total: 250 mg/dL — ABNORMAL HIGH (ref 100–199)
HDL: 87 mg/dL (ref >39)
LDL CALC: 150 mg/dL — AB (ref 0–99)
Triglycerides: 64 mg/dL (ref 0–149)
VLDL Cholesterol Cal: 13 mg/dL (ref 5–40)

## 2017-07-08 LAB — HCV INTERPRETATION

## 2017-07-08 LAB — HCV AB W/RFLX TO VERIFICATION: HCV Ab: <0.1 s/co ratio (ref 0.0–0.9)

## 2017-07-08 LAB — VITAMIN D 25 HYDROXY (VIT D DEFICIENCY, FRACTURES): Vit D, 25-Hydroxy: 21 ng/mL — ABNORMAL LOW (ref 30.0–100.0)

## 2017-07-08 LAB — TSH: TSH: 0.67 u[IU]/mL (ref 0.450–4.500)

## 2017-07-09 ENCOUNTER — Encounter: Payer: Self-pay | Admitting: Family Medicine

## 2017-07-09 DIAGNOSIS — R7303 Prediabetes: Secondary | ICD-10-CM | POA: Insufficient documentation

## 2017-07-09 DIAGNOSIS — M8580 Other specified disorders of bone density and structure, unspecified site: Secondary | ICD-10-CM | POA: Insufficient documentation

## 2017-07-09 DIAGNOSIS — E663 Overweight: Secondary | ICD-10-CM | POA: Insufficient documentation

## 2017-08-01 ENCOUNTER — Ambulatory Visit
Admission: RE | Admit: 2017-08-01 | Discharge: 2017-08-01 | Disposition: A | Discharge status: Home / Self Care | Payer: Medicare Other | Source: Ambulatory Visit | Attending: Family Medicine | Admitting: Family Medicine

## 2017-08-01 DIAGNOSIS — Z1231 Encounter for screening mammogram for malignant neoplasm of breast: Secondary | ICD-10-CM

## 2017-08-01 DIAGNOSIS — M8580 Other specified disorders of bone density and structure, unspecified site: Secondary | ICD-10-CM

## 2017-08-14 ENCOUNTER — Encounter: Payer: Self-pay | Admitting: Radiology

## 2018-01-23 ENCOUNTER — Other Ambulatory Visit: Payer: Self-pay

## 2018-01-23 ENCOUNTER — Ambulatory Visit: Payer: Medicare Other | Admitting: Physician Assistant

## 2018-01-23 ENCOUNTER — Ambulatory Visit (INDEPENDENT_AMBULATORY_CARE_PROVIDER_SITE_OTHER): Payer: Medicare Other

## 2018-01-23 ENCOUNTER — Encounter: Payer: Self-pay | Admitting: Physician Assistant

## 2018-01-23 VITALS — BP 137/79 | HR 79 | Temp 98.6°F | Resp 16 | Ht 63.19 in | Wt 168.2 lb

## 2018-01-23 DIAGNOSIS — M791 Myalgia, unspecified site: Secondary | ICD-10-CM | POA: Diagnosis not present

## 2018-01-23 DIAGNOSIS — M79662 Pain in left lower leg: Secondary | ICD-10-CM

## 2018-01-23 LAB — URINALYSIS, DIPSTICK ONLY
Bilirubin, UA: NEGATIVE
GLUCOSE, UA: NEGATIVE
Ketones, UA: NEGATIVE
LEUKOCYTES UA: NEGATIVE
NITRITE UA: NEGATIVE
PROTEIN UA: NEGATIVE
RBC, UA: NEGATIVE
Specific Gravity, UA: 1.021 (ref 1.005–1.030)
Urobilinogen, Ur: 0.2 mg/dL (ref 0.2–1.0)
pH, UA: 6 (ref 5.0–7.5)

## 2018-01-23 MED ORDER — MELOXICAM 7.5 MG PO TABS: 7.5000 mg | ORAL_TABLET | Freq: Every day | ORAL | 0 refills | Status: DC

## 2018-01-23 MED ORDER — CYCLOBENZAPRINE HCL 5 MG PO TABS: 5.0000 mg | ORAL_TABLET | Freq: Every day | ORAL | 0 refills | Status: DC

## 2018-01-23 NOTE — Progress Notes (Signed)
Isabella Berg  MRN: [id number] DOB: May 10, 1946  PCP: Shawnee Knapp, MD  Chief Complaint  Patient presents with  . Leg Pain    left leg pain x 1 month     Subjective:  Pt presents to clinic for left leg/shin pain that she has had for over an year but it has intermittent but for the last week it has constant.  She has been using tylenol and having trouble walking due to the pain.  The pain is in the shin and then she also has trouble with pain in her left buttocks that she gets after she has been laying down due to the pain in her shin and only gets this when she has had the shin pain for several days.  She does change her gait when the pain occurs in her shin.  She had no injury that she knows of.  Stabbing and aching pain that does not radiate into the calf area.  She has noticed no swelling. It is worse after she walks for a while resting does make it better but it does not resolve.  No injury to her back that she knows of.  She has tried ice and that does not help.  She retired 2 months ago and since then has really had minimal activity.  She gets no exercise.  History is obtained by patient.  Review of Systems  Musculoskeletal: Positive for back pain and gait problem (Secondary to pain).    Patient Active Problem List   Diagnosis Date Noted  . Osteopenia 07/09/2017  . Overweight (BMI 25.0-29.9) 07/09/2017  . Prediabetes 07/09/2017  . Vitamin D deficiency 07/07/2017  . Hyperlipidemia 11/02/2015  . Benign essential HTN 08/17/2015  . Hypothyroidism 08/17/2015    Current Outpatient Medications on File Prior to Visit  Medication Sig Dispense Refill  . diltiazem (CARDIZEM CD) 180 MG 24 hr capsule Take 1 capsule (180 mg total) by mouth daily. 90 capsule 3  . hydrochlorothiazide (HYDRODIURIL) 25 MG tablet TAKE 1 TABLET (25 MG TOTAL) BY MOUTH DAILY. 90 tablet 3  . levothyroxine (SYNTHROID, LEVOTHROID) 88 MCG tablet TAKE 1 TABLET BY MOUTH EVERY DAY BEFORE BREAKFAST 90 tablet 3  .  potassium chloride (KLOR-CON M10) 10 MEQ tablet TAKE 1 TABLET (10 MEQ TOTAL) BY MOUTH DAILY. 90 tablet 3   No current facility-administered medications on file prior to visit.     No Known Allergies  Past Medical History:  Diagnosis Date  . Hyperlipidemia   . Hypertension   . Thyroid disease    Social History   Social History Narrative  . Not on file   Social History   Tobacco Use  . Smoking status: Former Research scientist (life sciences)  . Smokeless tobacco: Never Used  Substance Use Topics  . Alcohol use: No  . Drug use: No   family history includes Cancer in her father; Hyperlipidemia in her brother and mother; Hypertension in her brother; Stroke in her brother.     Objective:  BP 137/79   Pulse 79   Temp 98.6 F (37 C) (Oral)   Resp 16   Ht 5' 3.19" (1.605 m)   Wt 168 lb 3.2 oz (76.3 kg)   SpO2 99%   BMI 29.62 kg/m  Body mass index is 29.62 kg/m.  Wt Readings from Last 3 Encounters:  01/23/18 168 lb 3.2 oz (76.3 kg)  07/07/17 161 lb (73 kg)  06/21/17 164 lb (74.4 kg)    Physical Exam  Constitutional: She is oriented to  person, place, and time. She appears well-developed and well-nourished.  HENT:  Head: Normocephalic and atraumatic.  Right Ear: Hearing and external ear normal.  Left Ear: Hearing and external ear normal.  Eyes: Conjunctivae are normal.  Neck: Normal range of motion.  Cardiovascular: Normal rate, regular rhythm and normal heart sounds.  No murmur heard. Pulses:      Dorsalis pedis pulses are 2+ on the right side, and 2+ on the left side.       Posterior tibial pulses are 2+ on the right side, and 2+ on the left side.  Pulmonary/Chest: Effort normal and breath sounds normal. She has no wheezes.  Musculoskeletal:       Right knee: Normal.       Left knee: Normal.       Right ankle: Normal.       Left ankle: Normal.       Lumbar back: She exhibits tenderness. She exhibits no bony tenderness and no spasm.  Muscle tenderness along lower back, thighs, along  the lateral aspect of lower leg.  Tenderness to palpation along the distal half of shin but mild, pt does not flinch with palpation.  No swelling, no erythema, no rash.  Neurological: She is alert and oriented to person, place, and time.  Skin: Skin is warm and dry.  Psychiatric: Judgment normal.  Vitals reviewed.  Dg Tibia/fibula Left  Result Date: 01/23/2018 CLINICAL DATA:  LEFT shin pain for week constantly, but intermittently over the past year EXAM: LEFT TIBIA AND FIBULA - 2 VIEW COMPARISON:  None FINDINGS: Mild osseous demineralization diffusely. Knee and ankle joint alignments normal. No acute fracture, dislocation or bone destruction. Corticated old ossicle adjacent to medial malleolus. Pretibial soft tissues grossly unremarkable. IMPRESSION: No acute abnormalities. Electronically Signed   By: Lavonia Dana M.D.   On: 01/23/2018 09:26    Assessment and Plan :  Pain in left shin - Plan: DG Tibia/Fibula Left, meloxicam (MOBIC) 7.5 MG tablet  Muscle tenderness - Plan: CBC with Differential/Platelet, CMP14+EGFR, CK, Urinalysis, dipstick only, cyclobenzaprine (FLEXERIL) 5 MG tablet, meloxicam (MOBIC) 7.5 MG tablet   Unsure of exact cause of patient's pain.  It has been going on a long time and though she is currently having a bad episode cannot really identify if this is overall getting worse.  She is quite tender with a lot of muscle palpation question may be fibromyalgia.  She has had no injury and her x-ray of the tibia is normal.  Her exam other than muscle tenderness is unimpressive.  We will do a trial of Mobic for inflammation and Flexeril for muscle relaxation to be taken at night.  Discussed with patient unsure of exact cause but will recheck in 2 to 3 weeks to see if there is been any improvement.  Also wait for labs to see if there is any underlying problem that was not able to be identified during exam.  Patient verbalized to me that they understand the following: diagnosis, what is  being done for them, what to expect and what should be done at home.  Their questions have been answered.  See after visit summary for patient specific instructions.  Windell Hummingbird PA-C  Primary [facility] at [facility] 01/23/2018 9:45 AM  Please note: Portions of this report may have been transcribed using dragon voice recognition software. Every effort was made to ensure accuracy; however, inadvertent computerized transcription errors may be present.

## 2018-01-23 NOTE — Patient Instructions (Addendum)
Please sign up for water aerobics and walk in the pool I think this will really help with your muscle pain.  Start walking outside at least 30 mins a day - that can be once a day or 15 mins 2x/day  Use the mobic in the am to help with pain  Ue the muscle relaxer at night.    IF you received an x-ray today, you will receive an invoice from [facility]. Please contact [facility] at [phone] with questions or concerns regarding your invoice.   IF you received labwork today, you will receive an invoice from [facility]. Please contact [facility] at [phone] with questions or concerns regarding your invoice.   Our billing staff will not be able to assist you with questions regarding bills from these companies.  You will be contacted with the lab results as soon as they are available. The fastest way to get your results is to activate your My Chart account. Instructions are located on the last page of this paperwork. If you have not heard from us regarding the results in 2 weeks, please contact this office.

## 2018-01-24 LAB — CMP14+EGFR
ALT: 12 IU/L (ref 0–32)
AST: 15 IU/L (ref 0–40)
Albumin/Globulin Ratio: 1.5 (ref 1.2–2.2)
Albumin: 4.1 g/dL (ref 3.5–4.8)
Alkaline Phosphatase: 165 IU/L — ABNORMAL HIGH (ref 39–117)
BUN / CREAT RATIO: 17 (ref 12–28)
BUN: 14 mg/dL (ref 8–27)
Bilirubin Total: 0.3 mg/dL (ref 0.0–1.2)
CO2: 23 mmol/L (ref 20–29)
CREATININE: 0.81 mg/dL (ref 0.57–1.00)
Calcium: 9.6 mg/dL (ref 8.7–10.3)
Chloride: 104 mmol/L (ref 96–106)
GFR calc non Af Amer: 73 mL/min/{1.73_m2} (ref >59)
GFR, EST AFRICAN AMERICAN: 84 mL/min/{1.73_m2} (ref >59)
GLUCOSE: 109 mg/dL — AB (ref 65–99)
Globulin, Total: 2.8 g/dL (ref 1.5–4.5)
Potassium: 3.9 mmol/L (ref 3.5–5.2)
Sodium: 142 mmol/L (ref 134–144)
TOTAL PROTEIN: 6.9 g/dL (ref 6.0–8.5)

## 2018-01-24 LAB — CBC WITH DIFFERENTIAL/PLATELET
BASOS ABS: 0 10*3/uL (ref 0.0–0.2)
Basos: 1 %
EOS (ABSOLUTE): 0.1 10*3/uL (ref 0.0–0.4)
Eos: 2 %
Hematocrit: 43.8 % (ref 34.0–46.6)
Hemoglobin: 14.5 g/dL (ref 11.1–15.9)
IMMATURE GRANS (ABS): 0 10*3/uL (ref 0.0–0.1)
Immature Granulocytes: 0 %
LYMPHS: 30 %
Lymphocytes Absolute: 1.2 10*3/uL (ref 0.7–3.1)
MCH: 27.2 pg (ref 26.6–33.0)
MCHC: 33.1 g/dL (ref 31.5–35.7)
MCV: 82 fL (ref 79–97)
MONOCYTES: 8 %
Monocytes Absolute: 0.3 10*3/uL (ref 0.1–0.9)
Neutrophils Absolute: 2.3 10*3/uL (ref 1.4–7.0)
Neutrophils: 59 %
Platelets: 284 10*3/uL (ref 150–450)
RBC: 5.34 x10E6/uL — AB (ref 3.77–5.28)
RDW: 15 % (ref 12.3–15.4)
WBC: 3.9 10*3/uL (ref 3.4–10.8)

## 2018-01-24 LAB — CK: Total CK: 69 U/L (ref 24–173)

## 2018-01-30 ENCOUNTER — Other Ambulatory Visit: Payer: Self-pay | Admitting: Family Medicine

## 2018-01-30 DIAGNOSIS — I10 Essential (primary) hypertension: Secondary | ICD-10-CM

## 2018-01-30 NOTE — Telephone Encounter (Signed)
HCTZ refill Last Refill:01/06/17 # 90 3 RF Last OV: 01/06/17 PCP: Dr Clelia CroftShaw [id number]  diltiazem refill Last Refill:01/06/17 # 90 3 RF Last OV: 01/06/17

## 2018-02-08 MED ORDER — DILTIAZEM HCL ER COATED BEADS 180 MG PO CP24: ORAL_CAPSULE | ORAL | 1 refills | Status: DC

## 2018-02-08 NOTE — Addendum Note (Signed)
Addended by: Sherren MochaSHAW, EVA N on: 02/08/2018 01:12 PM   Modules accepted: Orders

## 2018-02-12 ENCOUNTER — Ambulatory Visit: Payer: Medicare Other | Admitting: Family Medicine

## 2018-02-15 ENCOUNTER — Other Ambulatory Visit: Payer: Self-pay | Admitting: Physician Assistant

## 2018-02-15 DIAGNOSIS — M79662 Pain in left lower leg: Secondary | ICD-10-CM

## 2018-02-15 DIAGNOSIS — M791 Myalgia, unspecified site: Secondary | ICD-10-CM

## 2018-02-18 ENCOUNTER — Other Ambulatory Visit: Payer: Self-pay | Admitting: Physician Assistant

## 2018-02-18 DIAGNOSIS — M791 Myalgia, unspecified site: Secondary | ICD-10-CM

## 2018-02-18 NOTE — Telephone Encounter (Signed)
Flexeril refill Last Refill:01/23/18 # 30 with 0 refill Last OV: 01/23/18 PCP: Dr. Clelia Croft [facility] [address]

## 2018-02-18 NOTE — Telephone Encounter (Signed)
Patient is requesting a refill of the following medications: Requested Prescriptions   Pending Prescriptions Disp Refills  . cyclobenzaprine (FLEXERIL) 5 MG tablet [Pharmacy Med Name: CYCLOBENZAPRINE 5 MG TABLET] 30 tablet 0    Sig: TAKE 1 TABLET BY MOUTH EVERYDAY AT BEDTIME    Date of patient request: 02/18/2018 Last office visit: 01/23/2018 Date of last refill: 01/23/2018 Last refill amount: 30 Follow up time period per chart:

## 2018-03-16 ENCOUNTER — Other Ambulatory Visit: Payer: Self-pay | Admitting: Family Medicine

## 2018-03-16 DIAGNOSIS — I10 Essential (primary) hypertension: Secondary | ICD-10-CM

## 2018-03-16 NOTE — Telephone Encounter (Signed)
klor-con M10 refill Last Refill:01/06/17 # 90  And 3 refills Last OV: 01/23/18 PCP: E. Shaw [id number]  Prescription expired on 01/06/18

## 2018-03-26 ENCOUNTER — Other Ambulatory Visit: Payer: Self-pay | Admitting: Physician Assistant

## 2018-03-26 DIAGNOSIS — M791 Myalgia, unspecified site: Secondary | ICD-10-CM

## 2018-03-26 NOTE — Telephone Encounter (Signed)
Patient is requesting a refill of the following medications: Requested Prescriptions   Pending Prescriptions Disp Refills  . cyclobenzaprine (FLEXERIL) 5 MG tablet [Pharmacy Med Name: CYCLOBENZAPRINE 5 MG TABLET] 30 tablet 0    Sig: TAKE 1 TABLET BY MOUTH EVERYDAY AT BEDTIME    Date of patient request: 03/26/2018 Date of last refill: 02/18/2018 Last refill amount: 30 Follow up time period per chart:

## 2018-04-24 ENCOUNTER — Ambulatory Visit: Payer: Self-pay

## 2018-04-24 ENCOUNTER — Other Ambulatory Visit: Payer: Self-pay

## 2018-04-24 ENCOUNTER — Telehealth: Payer: Self-pay | Admitting: *Deleted

## 2018-04-24 ENCOUNTER — Other Ambulatory Visit: Payer: Self-pay | Admitting: *Deleted

## 2018-04-24 DIAGNOSIS — E038 Other specified hypothyroidism: Secondary | ICD-10-CM

## 2018-04-24 DIAGNOSIS — I10 Essential (primary) hypertension: Secondary | ICD-10-CM

## 2018-04-24 MED ORDER — HYDROCHLOROTHIAZIDE 25 MG PO TABS: ORAL_TABLET | ORAL | 0 refills | Status: DC

## 2018-04-24 MED ORDER — DILTIAZEM HCL ER COATED BEADS 180 MG PO CP24: ORAL_CAPSULE | ORAL | 0 refills | Status: DC

## 2018-04-24 MED ORDER — LEVOTHYROXINE SODIUM 88 MCG PO TABS: ORAL_TABLET | ORAL | 0 refills | Status: DC

## 2018-04-24 MED ORDER — POTASSIUM CHLORIDE CRYS ER 10 MEQ PO TBCR: EXTENDED_RELEASE_TABLET | ORAL | 0 refills | Status: DC

## 2018-04-24 NOTE — Telephone Encounter (Signed)
[facility] in [facility]

## 2018-04-24 NOTE — Telephone Encounter (Signed)
Per Dr. Clelia Croft, ok to fill Levothyroxine 88 mcg. Per Okey Regal at [facility] pt only wants #7 tablets while there with her daughter. [facility] [place], [place] [address]) [phone]

## 2018-04-24 NOTE — Telephone Encounter (Signed)
Called pt. to discuss her medication concern.  Reported she is in [place], [place], and anticipated only being there for one week, but will need to stay longer for personal reasons.  Reported she will need to have the following medications [facility] at the local [facility]: Diltiazem, Hydrochlorthiazide, Potassium Chloride, and Levothyroxine.  The pt. stated she will only have enough to get through until tomorrow, until she returns home.  Advised her Rx for Levothyroxine has expired, as of 01/2018.  The pt. reported having an abundance at one time, and that she has more of this medication at home.   Stated [facility] [address] in [place], to discuss quantity she can get for the best price.  Will call pt. back in 20 min. To f/u with her request.  Agrees with plan.         Message from Metro Kung sent at 04/24/2018 11:30 AM EST   Pt called in to be advised. Pt says that she has been with out her medication for 3 days because she don't have it with her. Pt is out of town. She would like to know if she will be okay without it?    CB: [phone]    Reason for Disposition . [1] Request for URGENT new prescription or refill of "essential" medication (i.e., likelihood of harm to patient if not taken) AND [2] triager unable to fill per unit policy    Requesting a limited supply of medication refilled while she is out of state.  Pt. Requesting refill on Diltiazem, Hydrochlorothiazide, Potassium Chloride, and Levothyroxine.   Noted that the Rx for Levothyroxine expired 01/2018.  Answer Assessment - Initial Assessment Questions 1. SYMPTOMS: "Do you have any symptoms?"     no 2. SEVERITY: If symptoms are present, ask "Are they mild, moderate or severe?"     n/a  Protocols used: MEDICATION QUESTION CALL-A-AH

## 2018-04-24 NOTE — Telephone Encounter (Signed)
Okey Regal from [facility] called and stated that the patient is out of medications and she is needing Levothyroxine 88 mcg for 1 week. She is in [place] with her daughter. Okey Regal stated that she didn't feel comfortable filling medication because med expired in August 2018. Pt will be out tomorrow, not sure if she was taking med properly.  Pharmacy is in [place]. [phone]

## 2018-04-24 NOTE — Telephone Encounter (Signed)
Spoke with pt.  Stated [facility] [place] [place].  Is requesting to receive a one week supply of the previously requested medications, to get her through until she returns home.  Call placed to [facility] at office.  Spoke with Burna Mortimer.  Will pend Levothyroxine for a refill, and send to office, to have Dr. Clelia Croft further advise.

## 2018-04-24 NOTE — Telephone Encounter (Signed)
Wanda sent in the requested #7 tabs on refill.  Pt does not have appt to be seen - please sched OV asap for additional med refills. Thanks.

## 2018-04-27 ENCOUNTER — Telehealth: Payer: Self-pay | Admitting: Family Medicine

## 2018-04-27 NOTE — Telephone Encounter (Signed)
Called pt to advise of the refill per Dr. Clelia Croft. I advised that she would need to see Dr. Clelia Croft before any additional refills. When pt calls back, please schedule with Clelia Croft ASAP for med refills. Thank you!

## 2018-05-17 ENCOUNTER — Other Ambulatory Visit: Payer: Self-pay | Admitting: Family Medicine

## 2018-05-17 DIAGNOSIS — E038 Other specified hypothyroidism: Secondary | ICD-10-CM

## 2018-05-18 NOTE — Telephone Encounter (Signed)
Attempted to call patient regarding scheduling an appointment for the refill for the levothyroxine 88 mcg. No answer, left message to call the office back and schedule an appointment. Routing to PCP.

## 2018-06-11 NOTE — Telephone Encounter (Signed)
Pt called and made appointment for 07/07/18 and would like to know if she can get her medications refilled until then. Please advise

## 2018-06-30 ENCOUNTER — Telehealth: Payer: Self-pay | Admitting: Family Medicine

## 2018-06-30 NOTE — Telephone Encounter (Signed)
Called and spoke with pt regarding appt scheduled for 1.21.20 with Dr. Clelia Croft. I advised pt of Dr. Clelia Croft being on injury leave. I was able to get pt rescheduled for an appt with Dr. Creta Levin on 1.21.20 at 1:20 . I advised of time, building and late policy. Pt acknowledged.

## 2018-07-07 ENCOUNTER — Other Ambulatory Visit: Payer: Self-pay

## 2018-07-07 ENCOUNTER — Ambulatory Visit: Payer: Medicare Other | Admitting: Family Medicine

## 2018-07-07 ENCOUNTER — Ambulatory Visit (INDEPENDENT_AMBULATORY_CARE_PROVIDER_SITE_OTHER): Payer: Medicare Other | Admitting: Family Medicine

## 2018-07-07 ENCOUNTER — Encounter: Payer: Self-pay | Admitting: Family Medicine

## 2018-07-07 VITALS — BP 139/83 | HR 81 | Temp 98.9°F | Resp 17 | Ht 63.19 in | Wt 166.2 lb

## 2018-07-07 DIAGNOSIS — E559 Vitamin D deficiency, unspecified: Secondary | ICD-10-CM

## 2018-07-07 DIAGNOSIS — I10 Essential (primary) hypertension: Secondary | ICD-10-CM | POA: Diagnosis not present

## 2018-07-07 DIAGNOSIS — R7303 Prediabetes: Secondary | ICD-10-CM

## 2018-07-07 DIAGNOSIS — E7800 Pure hypercholesterolemia, unspecified: Secondary | ICD-10-CM

## 2018-07-07 DIAGNOSIS — E038 Other specified hypothyroidism: Secondary | ICD-10-CM

## 2018-07-07 DIAGNOSIS — Z5181 Encounter for therapeutic drug level monitoring: Secondary | ICD-10-CM

## 2018-07-07 DIAGNOSIS — E0580 Other thyrotoxicosis without thyrotoxic crisis or storm: Secondary | ICD-10-CM

## 2018-07-07 DIAGNOSIS — E890 Postprocedural hypothyroidism: Secondary | ICD-10-CM

## 2018-07-07 MED ORDER — DILTIAZEM HCL ER COATED BEADS 180 MG PO CP24: ORAL_CAPSULE | ORAL | 1 refills | Status: DC

## 2018-07-07 MED ORDER — HYDROCHLOROTHIAZIDE 25 MG PO TABS: ORAL_TABLET | ORAL | 1 refills | Status: DC

## 2018-07-07 MED ORDER — POTASSIUM CHLORIDE CRYS ER 10 MEQ PO TBCR: EXTENDED_RELEASE_TABLET | ORAL | 1 refills | Status: DC

## 2018-07-07 NOTE — Patient Instructions (Addendum)
Pick up some over the counter vitamin D-3 2000 international units    If you have lab work done today you will be contacted with your lab results within the next 2 weeks.  If you have not heard from [place] then please contact us. The fastest way to get your results is to register for My Chart.   IF you received an x-ray today, you will receive an invoice from [facility]. Please contact [facility] at [phone] with questions or concerns regarding your invoice.   IF you received labwork today, you will receive an invoice from [facility]. Please contact [facility] at [phone] with questions or concerns regarding your invoice.   Our billing staff will not be able to assist you with questions regarding bills from these companies.  You will be contacted with the lab results as soon as they are available. The fastest way to get your results is to activate your My Chart account. Instructions are located on the last page of this paperwork. If you have not heard from [place] regarding the results in 2 weeks, please contact this office.     Hypothyroidism  Hypothyroidism is when the thyroid gland does not make enough of certain hormones (it is underactive). The thyroid gland is a small gland located in the lower front part of the neck, just in front of the windpipe (trachea). This gland makes hormones that help control how the body uses food for energy (metabolism) as well as how the heart and brain function. These hormones also play a role in keeping your bones strong. When the thyroid is underactive, it produces too little of the hormones thyroxine (T4) and triiodothyronine (T3). What are the causes? This condition may be caused by:  Hashimoto's disease. This is a disease in which the body's disease-fighting system (immune system) attacks the thyroid gland. This is the most common cause.  Viral infections.  Pregnancy.  Certain medicines.  Birth defects.  Past radiation  treatments to the head or neck for cancer.  Past treatment with radioactive iodine.  Past exposure to radiation in the environment.  Past surgical removal of part or all of the thyroid.  Problems with a gland in the center of the brain (pituitary gland).  Lack of enough iodine in the diet. What increases the risk? You are more likely to develop this condition if:  You are female.  You have a family history of thyroid conditions.  You use a medicine called lithium.  You take medicines that affect the immune system (immunosuppressants). What are the signs or symptoms? Symptoms of this condition include:  Feeling as though you have no energy (lethargy).  Not being able to tolerate cold.  Weight gain that is not explained by a change in diet or exercise habits.  Lack of appetite.  Dry skin.  Coarse hair.  Menstrual irregularity.  Slowing of thought processes.  Constipation.  Sadness or depression. How is this diagnosed? This condition may be diagnosed based on:  Your symptoms, your medical history, and a physical exam.  Blood tests. You may also have imaging tests, such as an ultrasound or MRI. How is this treated? This condition is treated with medicine that replaces the thyroid hormones that your body does not make. After you begin treatment, it may take several weeks for symptoms to go away. Follow these instructions at home:  Take over-the-counter and prescription medicines only as told by your health care provider.  If you start taking any new medicines, tell your health  care provider.  Keep all follow-up visits as told by your health care provider. This is important. ? As your condition improves, your dosage of thyroid hormone medicine may change. ? You will need to have blood tests regularly so that your health care provider can monitor your condition. Contact a health care provider if:  Your symptoms do not get better with treatment.  You are  taking thyroid replacement medicine and you: ? Sweat a lot. ? Have tremors. ? Feel anxious. ? Lose weight rapidly. ? Cannot tolerate heat. ? Have emotional swings. ? Have diarrhea. ? Feel weak. Get help right away if you have:  Chest pain.  An irregular heartbeat.  A rapid heartbeat.  Difficulty breathing. Summary  Hypothyroidism is when the thyroid gland does not make enough of certain hormones (it is underactive).  When the thyroid is underactive, it produces too little of the hormones thyroxine (T4) and triiodothyronine (T3).  The most common cause is Hashimoto's disease, a disease in which the body's disease-fighting system (immune system) attacks the thyroid gland. The condition can also be caused by viral infections, medicine, pregnancy, or past radiation treatment to the head or neck.  Symptoms may include weight gain, dry skin, constipation, feeling as though you do not have energy, and not being able to tolerate cold.  This condition is treated with medicine to replace the thyroid hormones that your body does not make. This information is not intended to replace advice given to you by your health care provider. Make sure you discuss any questions you have with your health care provider. Document Released: 06/03/2005 Document Revised: 05/14/2017 Document Reviewed: 05/14/2017 Elsevier Interactive Patient Education  2019 [employer]

## 2018-07-07 NOTE — Progress Notes (Signed)
Established Patient Office Visit  Subjective:  Patient ID: Isabella Berg, female    DOB: [birth year]  Age: 73 y.o. MRN: [id number]  CC:  Chief Complaint  Patient presents with  . Medication Refill    levothyroxine, diltiazem, hctz and potassium chloride    HPI [name] presents for   Hypertension: Patient here for follow-up of elevated blood pressure. She is not exercising and is adherent to low salt diet.  Blood pressure is well controlled at home. Cardiac symptoms none. Patient denies chest pressure/discomfort, claudication, exertional chest pressure/discomfort, fatigue, irregular heart beat and lower extremity edema.  Cardiovascular risk factors: dyslipidemia, hypertension and sedentary lifestyle. Use of agents associated with hypertension: thyroid hormones. History of target organ damage: none. BP Readings from Last 3 Encounters:  07/07/18 139/83  01/23/18 137/79  07/07/17 138/80   Thyroid: Patient presents for evaluation of hypothyroidism. Current symptoms include denies fatigue, weight changes, heat/cold intolerance, bowel/skin changes or CVS symptoms. Patient denies denies fatigue, weight changes, heat/cold intolerance, bowel/skin changes or CVS symptoms.  She takes levothyroxine 42mg  Dyslipidemia: Patient presents for evaluation of lipids. She does not take a statin due to bad reactions and pain from statins.  She eats a healthy diet.   A repeat fasting lipid profile was done.  The patient does use medications that may worsen dyslipidemias (corticosteroids, progestins, anabolic steroids, diuretics, beta-blockers, amiodarone, cyclosporine, olanzapine). The patient exercises intermittently.  The patient is not known to have coexisting coronary artery disease.   Vitamin D deficiency Pt reports that she was not taking any vitamin D She did not know why kind to get over the counter She denies bone pain but has some fatigue She had complete thyroidectomy due to goiter  but denies history of kidney disease or parathyroidism  Past Medical History:  Diagnosis Date  . Hyperlipidemia   . Hypertension   . Thyroid disease     Past Surgical History:  Procedure Laterality Date  . thyroid     Removal 2002    Family History  Problem Relation Age of Onset  . Hyperlipidemia Mother   . Cancer Father   . Hypertension Brother   . Stroke Brother   . Hyperlipidemia Brother     Social History   Socioeconomic History  . Marital status: Widowed    Spouse name: Not on file  . Number of children: Not on file  . Years of education: Not on file  . Highest education level: Not on file  Occupational History  . 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 Financial resource strain: Not on file  . Food insecurity:    Worry: Not on file    Inability: Not on file  . Transportation needs:    Medical: Not on file    Non-medical: Not on file  Tobacco Use  . Smoking status: Former SResearch scientist (life sciences) . Smokeless tobacco: Never Used  Substance and Sexual Activity  . Alcohol use: No  . Drug use: No  . Sexual activity: Not on file  Lifestyle  . Physical activity:    Days per week: Not on file    Minutes per session: Not on file  . Stress: Not on file  Relationships  . Social connections:    Talks on phone: Not on file    Gets together: Not on file    Attends religious service: Not on file    Active member of club or organization:  Not on file    Attends meetings of clubs or organizations: Not on file    Relationship status: Not on file  . Intimate partner violence:    Fear of current or ex partner: Not on file    Emotionally abused: Not on file    Physically abused: Not on file    Forced sexual activity: Not on file  Other Topics Concern  . Not on file  Social History Narrative  . Not on file    Outpatient Medications Prior to Visit  Medication Sig Dispense Refill  . diltiazem (CARDIZEM CD)  180 MG 24 hr capsule TAKE 1 CAPSULE BY MOUTH EVERY DAY 7 capsule 0  . hydrochlorothiazide (HYDRODIURIL) 25 MG tablet TAKE 1 TABLET BY MOUTH EVERY DAY 7 tablet 0  . levothyroxine (SYNTHROID, LEVOTHROID) 88 MCG tablet TAKE 1 TABLET BY MOUTH EVERY DAY BEFORE BREAKFAST 7 tablet 0  . potassium chloride (KLOR-CON M10) 10 MEQ tablet TAKE 1 TABLET BY MOUTH EVERY DAY 7 tablet 0  . cyclobenzaprine (FLEXERIL) 5 MG tablet TAKE 1 TABLET BY MOUTH EVERYDAY AT BEDTIME (Patient not taking: Reported on 07/07/2018) 30 tablet 0  . meloxicam (MOBIC) 7.5 MG tablet TAKE 1-2 TABLETS (7.5-15 MG TOTAL) BY MOUTH DAILY. (Patient not taking: Reported on 07/07/2018) 60 tablet 0   No facility-administered medications prior to visit.     No Known Allergies  ROS Review of Systems See hpi Review of Systems  Constitutional: Negative for activity change, appetite change, chills and fever.  HENT: Negative for congestion, nosebleeds, trouble swallowing and voice change.   Respiratory: Negative for cough, shortness of breath and wheezing.   Gastrointestinal: Negative for diarrhea, nausea and vomiting.  Genitourinary: Negative for difficulty urinating, dysuria, flank pain and hematuria.  Musculoskeletal: Negative for back pain, joint swelling and neck pain.  Neurological: Negative for dizziness, speech difficulty, light-headedness and numbness.  See HPI. All other review of systems negative.      Objective:     BP 139/83 (BP Location: Right Arm, Patient Position: Sitting, Cuff Size: Normal)   Pulse 81   Temp 98.9 F (37.2 C) (Oral)   Resp 17   Ht 5' 3.19" (1.605 m)   Wt 166 lb 4 oz (75.4 kg)   SpO2 96%   BMI 29.27 kg/m  Wt Readings from Last 3 Encounters:  07/07/18 166 lb 4 oz (75.4 kg)  01/23/18 168 lb 3.2 oz (76.3 kg)  07/07/17 161 lb (73 kg)    Physical Exam  Physical Exam  Constitutional: Oriented to person, place, and time. Appears well-developed and well-nourished.  HENT:  Head: Normocephalic and  atraumatic.  Eyes: Conjunctivae and EOM are normal.  Neck: thyroid surgically absent Cardiovascular: Normal rate, regular rhythm, normal heart sounds and intact distal pulses.  No murmur heard. Pulmonary/Chest: Effort normal and breath sounds normal. No stridor. No respiratory distress. Has no wheezes.  Neurological: Is alert and oriented to person, place, and time.  Skin: Skin is warm. Capillary refill takes less than 2 seconds.  Psychiatric: Has a normal mood and affect. Behavior is normal. Judgment and thought content normal.     There are no preventive care reminders to display for this patient.  There are no preventive care reminders to display for this patient.  Lab Results  Component Value Date   TSH 0.670 07/07/2017   Lab Results  Component Value Date   WBC 3.9 01/23/2018   HGB 14.5 01/23/2018   HCT 43.8 01/23/2018   MCV 82 01/23/2018  PLT 284 01/23/2018   Lab Results  Component Value Date   NA 142 01/23/2018   K 3.9 01/23/2018   CO2 23 01/23/2018   GLUCOSE 109 (H) 01/23/2018   BUN 14 01/23/2018   CREATININE 0.81 01/23/2018   BILITOT 0.3 01/23/2018   ALKPHOS 165 (H) 01/23/2018   AST 15 01/23/2018   ALT 12 01/23/2018   PROT 6.9 01/23/2018   ALBUMIN 4.1 01/23/2018   CALCIUM 9.6 01/23/2018   Lab Results  Component Value Date   CHOL 250 (H) 07/07/2017   Lab Results  Component Value Date   HDL 87 07/07/2017   Lab Results  Component Value Date   LDLCALC 150 (H) 07/07/2017   Lab Results  Component Value Date   TRIG 64 07/07/2017   Lab Results  Component Value Date   CHOLHDL 2.9 07/07/2017   Lab Results  Component Value Date   HGBA1C 6.0 (H) 07/07/2017      Assessment & Plan:   Problem List Items Addressed This Visit      Cardiovascular and Mediastinum   Benign essential HTN Patient's blood pressure is at goal of 139/89 or less. Condition is stable. Continue current medications and treatment plan. I recommend that you exercise for 30-45  minutes 5 days a week. I also recommend a balanced diet with fruits and vegetables every day, lean meats, and little fried foods. The DASH diet (you can find this online) is a good example of this.    Relevant Orders   CMP14+EGFR     Endocrine   Hypothyroidism now with iatrogenic hyperthyroidism -   Discussed goals for thyroid to maintain TSH between 1 and 4 Decreased dose due to iatrogenic hyperthyroidism   Relevant Orders   TSH     Other   Hyperlipidemia  - Discussed medications that affect lipids Reminded patient to avoid grapefruits Reviewed last 3 lipids Advised dietary fiber and fish oil and ways to keep HDL high CAD prevention and reviewed side effects of statins    Relevant Orders   Lipid panel   Vitamin D deficiency - Primary - continue vitamin D over-the-counter Discussed importance of vitamin D for bone health and cognitive function    Relevant Orders   Vitamin D, 25-hydroxy   Prediabetes - discussed her previous A1c  Stable, continue healthy lifestyle No need for meds at this time   Relevant Orders   Hemoglobin A1c      No orders of the defined types were placed in this encounter.   Follow-up: No follow-ups on file.    Forrest Moron, MD

## 2018-07-08 LAB — LIPID PANEL
CHOLESTEROL TOTAL: 253 mg/dL — AB (ref 100–199)
Chol/HDL Ratio: 3.2 ratio (ref 0.0–4.4)
HDL: 79 mg/dL (ref >39)
LDL CALC: 163 mg/dL — AB (ref 0–99)
TRIGLYCERIDES: 54 mg/dL (ref 0–149)
VLDL Cholesterol Cal: 11 mg/dL (ref 5–40)

## 2018-07-08 LAB — CMP14+EGFR
ALT: 14 IU/L (ref 0–32)
AST: 12 IU/L (ref 0–40)
Albumin/Globulin Ratio: 1.5 (ref 1.2–2.2)
Albumin: 4.1 g/dL (ref 3.7–4.7)
Alkaline Phosphatase: 173 IU/L — ABNORMAL HIGH (ref 39–117)
BILIRUBIN TOTAL: 0.4 mg/dL (ref 0.0–1.2)
BUN/Creatinine Ratio: 16 (ref 12–28)
BUN: 12 mg/dL (ref 8–27)
CALCIUM: 9.7 mg/dL (ref 8.7–10.3)
CHLORIDE: 103 mmol/L (ref 96–106)
CO2: 24 mmol/L (ref 20–29)
Creatinine, Ser: 0.77 mg/dL (ref 0.57–1.00)
GFR, EST AFRICAN AMERICAN: 89 mL/min/{1.73_m2} (ref >59)
GFR, EST NON AFRICAN AMERICAN: 77 mL/min/{1.73_m2} (ref >59)
GLUCOSE: 87 mg/dL (ref 65–99)
Globulin, Total: 2.8 g/dL (ref 1.5–4.5)
Potassium: 4 mmol/L (ref 3.5–5.2)
Sodium: 144 mmol/L (ref 134–144)
TOTAL PROTEIN: 6.9 g/dL (ref 6.0–8.5)

## 2018-07-08 LAB — HEMOGLOBIN A1C
Est. average glucose Bld gHb Est-mCnc: 126 mg/dL
Hgb A1c MFr Bld: 6 % — ABNORMAL HIGH (ref 4.8–5.6)

## 2018-07-08 LAB — TSH: TSH: 0.057 u[IU]/mL — ABNORMAL LOW (ref 0.450–4.500)

## 2018-07-08 LAB — VITAMIN D 25 HYDROXY (VIT D DEFICIENCY, FRACTURES): VIT D 25 HYDROXY: 14.4 ng/mL — AB (ref 30.0–100.0)

## 2018-07-08 MED ORDER — LEVOTHYROXINE SODIUM 75 MCG PO TABS: 75.0000 ug | ORAL_TABLET | Freq: Every day | ORAL | 1 refills | Status: DC

## 2018-12-27 ENCOUNTER — Other Ambulatory Visit: Payer: Self-pay | Admitting: Family Medicine

## 2018-12-27 DIAGNOSIS — E038 Other specified hypothyroidism: Secondary | ICD-10-CM

## 2018-12-27 NOTE — Telephone Encounter (Signed)
Requested Prescriptions  Pending Prescriptions Disp Refills  . levothyroxine (SYNTHROID) 75 MCG tablet [Pharmacy Med Name: LEVOTHYROXINE 75 MCG TABLET] 30 tablet 0    Sig: TAKE 1 TABLET BY MOUTH DAILY BEFORE BREAKFAST. TAKE 30-60 MINUTES BEFORE BREAKFAST. ON EMPTY STOMACH     Endocrinology:  Hypothyroid Agents Failed - 12/27/2018  1:00 AM      Failed - TSH needs to be rechecked within 3 months after an abnormal result. Refill until TSH is due.      Failed - TSH in normal range and within 360 days    TSH  Date Value Ref Range Status  07/07/2018 0.057 (L) 0.450 - 4.500 uIU/mL Final         Passed - Valid encounter within last 12 months    Recent Outpatient Visits          5 months ago Benign essential HTN   [facility], [place], [place]   11 months ago Pain in left shin   [facility] at [place], PA-C   1 year ago Annual physical exam   [facility], Laurey Arrow, MD   1 year ago Acute bronchitis, unspecified organism   [facility], [place]urey Arrow, MD   1 year ago Benign essential HTN   [facility], [place] Arrow, MD      Future Appointments            In 1 week Forrest Moron, MD [facility] at [place], [facility]

## 2019-01-04 ENCOUNTER — Other Ambulatory Visit: Payer: Self-pay

## 2019-01-04 ENCOUNTER — Ambulatory Visit (INDEPENDENT_AMBULATORY_CARE_PROVIDER_SITE_OTHER): Payer: Medicare Other | Admitting: Family Medicine

## 2019-01-04 ENCOUNTER — Encounter: Payer: Self-pay | Admitting: Family Medicine

## 2019-01-04 VITALS — BP 121/78 | HR 71 | Temp 98.3°F | Resp 17 | Ht 63.19 in | Wt 168.6 lb

## 2019-01-04 DIAGNOSIS — E7800 Pure hypercholesterolemia, unspecified: Secondary | ICD-10-CM | POA: Diagnosis not present

## 2019-01-04 DIAGNOSIS — R7303 Prediabetes: Secondary | ICD-10-CM

## 2019-01-04 DIAGNOSIS — I10 Essential (primary) hypertension: Secondary | ICD-10-CM | POA: Diagnosis not present

## 2019-01-04 DIAGNOSIS — E559 Vitamin D deficiency, unspecified: Secondary | ICD-10-CM | POA: Diagnosis not present

## 2019-01-04 DIAGNOSIS — E038 Other specified hypothyroidism: Secondary | ICD-10-CM

## 2019-01-04 DIAGNOSIS — E890 Postprocedural hypothyroidism: Secondary | ICD-10-CM

## 2019-01-04 NOTE — Progress Notes (Signed)
 Established Patient Office Visit  Subjective:  Patient ID: Isabella Berg, female    DOB: 06/05/1946  Age: 72 y.o. MRN: [id number]  CC:  Chief Complaint  Patient presents with  . labs and med refill    refill on diltiazem, hctz, levothyroxine, potassium    HPI Isabella Berg presents for   Iatrogenic Hyperthyroidism Her last tsh was hyperthyroid from too high of a dose so the dose was decreased from 88mcg to 75mcg She is compliant with her thyroid medication but has only missed a day here or there. She uses a pill box to help her compliance. She denies any constipations, diarrhea, mood swings, skin changes or energy changes She has postsurgical hypothyroidism Lab Results  Component Value Date   TSH 0.057 (L) 07/07/2018   Prediabetes She is not exercising She walks her dog twice a day for 10 minutes total She does not exercise because of the heat She is sticking to a healthy diet and eats salads Wt Readings from Last 3 Encounters:  01/04/19 168 lb 9.6 oz (76.5 kg)  07/07/18 166 lb 4 oz (75.4 kg)  01/23/18 168 lb 3.2 oz (76.3 kg)     Hypertension: Patient here for follow-up of elevated blood pressure. She is not exercising and is adherent to low salt diet.  Blood pressure is well controlled at home. Cardiac symptoms none. Patient denies chest pain, claudication, exertional chest pressure/discomfort, fatigue, irregular heart beat, near-syncope, orthopnea and palpitations.  Cardiovascular risk factors: dyslipidemia and hypertension. Use of agents associated with hypertension: none. History of target organ damage: none. BP Readings from Last 3 Encounters:  01/04/19 121/78  07/07/18 139/83  01/23/18 137/79    Hyperlipidemia She reports that she eats meat and steak occasionally She mostly eats chicken and shrimp She reports that her family history is negative for heart disease  Lab Results  Component Value Date   CHOL 253 (H) 07/07/2018   CHOL 250 (H) 07/07/2017   CHOL 261 (H) 01/06/2017   Lab Results  Component Value Date   HDL 79 07/07/2018   HDL 87 07/07/2017   HDL 91 01/06/2017   Lab Results  Component Value Date   LDLCALC 163 (H) 07/07/2018   LDLCALC 150 (H) 07/07/2017   LDLCALC 157 (H) 01/06/2017   Lab Results  Component Value Date   TRIG 54 07/07/2018   TRIG 64 07/07/2017   TRIG 64 01/06/2017   Lab Results  Component Value Date   CHOLHDL 3.2 07/07/2018   CHOLHDL 2.9 07/07/2017   CHOLHDL 2.9 01/06/2017   No results found for: LDLDIRECT The 10-year ASCVD risk score (Goff DC Jr., et al., 2013) is: 17.1%   Values used to calculate the score:     Age: 72 years     Sex: Female     Is Non-Hispanic African American: Yes     Diabetic: No     Tobacco smoker: No     Systolic Blood Pressure: 121 mmHg     Is BP treated: Yes     HDL Cholesterol: 79 mg/dL     Total Cholesterol: 253 mg/dL   Past Medical History:  Diagnosis Date  . Hyperlipidemia   . Hypertension   . Thyroid disease     Past Surgical History:  Procedure Laterality Date  . thyroid     Removal 2002    Family History  Problem Relation Age of Onset  . Hyperlipidemia Mother   . Cancer Father   . Hypertension Brother   . Stroke   Brother   . Hyperlipidemia Brother     Social History   Socioeconomic History  . Marital status: Widowed    Spouse name: Not on file  . Number of children: Not on file  . Years of education: Not on file  . Highest education level: Not on file  Occupational History  . 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 Financial resource strain: Not on file  . Food insecurity    Worry: Not on file    Inability: Not on file  . Transportation needs    Medical: Not on file    Non-medical: Not on file  Tobacco Use  . Smoking status: Former Smoker  . Smokeless tobacco: Never Used  Substance and Sexual Activity  . Alcohol use: No  . Drug use: No  . Sexual activity:  Not on file  Lifestyle  . Physical activity    Days per week: Not on file    Minutes per session: Not on file  . Stress: Not on file  Relationships  . Social connections    Talks on phone: Not on file    Gets together: Not on file    Attends religious service: Not on file    Active member of club or organization: Not on file    Attends meetings of clubs or organizations: Not on file    Relationship status: Not on file  . Intimate partner violence    Fear of current or ex partner: Not on file    Emotionally abused: Not on file    Physically abused: Not on file    Forced sexual activity: Not on file  Other Topics Concern  . Not on file  Social History Narrative  . Not on file    Outpatient Medications Prior to Visit  Medication Sig Dispense Refill  . diltiazem (CARDIZEM CD) 180 MG 24 hr capsule TAKE 1 CAPSULE BY MOUTH EVERY DAY 90 capsule 1  . hydrochlorothiazide (HYDRODIURIL) 25 MG tablet TAKE 1 TABLET BY MOUTH EVERY DAY 90 tablet 1  . levothyroxine (SYNTHROID) 75 MCG tablet TAKE 1 TABLET BY MOUTH DAILY BEFORE BREAKFAST. TAKE 30-60 MINUTES BEFORE BREAKFAST. ON EMPTY STOMACH 30 tablet 0  . potassium chloride (KLOR-CON M10) 10 MEQ tablet TAKE 1 TABLET BY MOUTH EVERY DAY 90 tablet 1   No facility-administered medications prior to visit.     No Known Allergies  ROS Review of Systems Review of Systems  Constitutional: Negative for activity change, appetite change, chills and fever.  HENT: Negative for congestion, nosebleeds, trouble swallowing and voice change.   Respiratory: Negative for cough, shortness of breath and wheezing.   Gastrointestinal: Negative for diarrhea, nausea and vomiting.  Genitourinary: Negative for difficulty urinating, dysuria, flank pain and hematuria.  Musculoskeletal: Negative for back pain, joint swelling and neck pain.  Neurological: Negative for dizziness, speech difficulty, light-headedness and numbness.  See HPI. All other review of systems  negative.     Objective:    Physical Exam  BP 121/78 (BP Location: Right Arm, Patient Position: Sitting, Cuff Size: Large)   Pulse 71   Temp 98.3 F (36.8 C) (Oral)   Resp 17   Ht 5' 3.19" (1.605 m)   Wt 168 lb 9.6 oz (76.5 kg)   SpO2 98%   BMI 29.69 kg/m  Wt Readings from Last 3 Encounters:  01/04/19 168 lb 9.6 oz (76.5 kg)  07/07/18 166 lb 4 oz (75.4   kg)  01/23/18 168 lb 3.2 oz (76.3 kg)   Physical Exam  Constitutional: Oriented to person, place, and time. Appears well-developed and well-nourished.  HENT:  Head: Normocephalic and atraumatic.  Eyes: Conjunctivae and EOM are normal.  Neck: supple, thyroid surgically absent, no LAD Cardiovascular: Normal rate, regular rhythm, normal heart sounds and intact distal pulses.  No murmur heard. Pulmonary/Chest: Effort normal and breath sounds normal. No stridor. No respiratory distress. Has no wheezes.  Neurological: Is alert and oriented to person, place, and time.  Skin: Skin is warm. Capillary refill takes less than 2 seconds.  Psychiatric: Has a normal mood and affect. Behavior is normal. Judgment and thought content normal.    Health Maintenance Due  Topic Date Due  . COLONOSCOPY  06/17/2014    There are no preventive care reminders to display for this patient.  Lab Results  Component Value Date   TSH 0.057 (L) 07/07/2018   Lab Results  Component Value Date   WBC 3.9 01/23/2018   HGB 14.5 01/23/2018   HCT 43.8 01/23/2018   MCV 82 01/23/2018   PLT 284 01/23/2018   Lab Results  Component Value Date   NA 144 07/07/2018   K 4.0 07/07/2018   CO2 24 07/07/2018   GLUCOSE 87 07/07/2018   BUN 12 07/07/2018   CREATININE 0.77 07/07/2018   BILITOT 0.4 07/07/2018   ALKPHOS 173 (H) 07/07/2018   AST 12 07/07/2018   ALT 14 07/07/2018   PROT 6.9 07/07/2018   ALBUMIN 4.1 07/07/2018   CALCIUM 9.7 07/07/2018   Lab Results  Component Value Date   CHOL 253 (H) 07/07/2018   Lab Results  Component Value Date   HDL  79 07/07/2018   Lab Results  Component Value Date   LDLCALC 163 (H) 07/07/2018   Lab Results  Component Value Date   TRIG 54 07/07/2018   Lab Results  Component Value Date   CHOLHDL 3.2 07/07/2018   Lab Results  Component Value Date   HGBA1C 6.0 (H) 07/07/2018      Assessment & Plan:   Problem List Items Addressed This Visit      Cardiovascular and Mediastinum   Benign essential HTN- Patient's blood pressure is at goal of 139/89 or less. Condition is stable. Continue current medications and treatment plan. I recommend that you exercise for 30-45 minutes 5 days a week. I also recommend a balanced diet with fruits and vegetables every day, lean meats, and little fried foods. The DASH diet (you can find this online) is a good example of this. Continue diltiazem and hctz Will refill after labs    Relevant Orders   CMP14+EGFR     Endocrine   Hypothyroidism - pt is not taking medication on an empty stomach and since she takes it with ALL her other meds her absorption is variable   Relevant Orders   TSH     Other   Hyperlipidemia - if levels are increasing will add on welchol and red yeast rice to help improved LDL   Relevant Orders   Lipid panel   Vitamin D deficiency - Primary Pt was taking the 2000 units will assess improvement   Relevant Orders   Vitamin D, 25-hydroxy   Prediabetes - healthy diet  Will monitor    Relevant Orders   POCT glycosylated hemoglobin (Hb A1C)   CMP14+EGFR      No orders of the defined types were placed in this encounter.   Follow-up: No follow-ups on file.  Forrest Moron, MD

## 2019-01-04 NOTE — Patient Instructions (Addendum)
Try Red Yeast Rice over the counter  Welchol (Colesevelam)  Colesevelam belongs to a class of drugs called bile acid-binding resins. Bile acid is a natural substance the liver makes by using cholesterol. This medication works by removing bile acid from the body. This causes the liver to make more bile acid by using cholesterol, which reduces cholesterol levels in the blood. It is not known how colesevelam works in lowering blood sugar.  How to use Welchol 625 Mg Tablet Take this medication by mouth with a meal, usually 1 to 2 times daily or as directed by your doctor. Take the tablet form with a liquid (such as water, milk). If you have trouble swallowing the tablet, talk to your doctor about switching to the powder form of this medication.  If you are using the powder form of this medication, pour the contents of one packet into a glass. Add one cup (8 ounces/240 milliliters) of water, fruit juice, or diet soda. Stir the mixture well and drink all of it. Do not take the powder without mixing it in liquid.  Take this medication regularly to get the most benefit from it. To help you remember, take it at the same time(s) each day.  It may take several weeks before you get the full benefit of this drug.  Colesevelam may decrease the absorption of other products you may be taking. Some examples include cyclosporine, glipizide, glimepiride, glyburide, levothyroxine, and phenytoin, as well as birth control pills that contain ethinyl estradiol and norethindrone. Take other medications as directed by your doctor, usually at least 4 hours before taking your colesevelam dose. Ask your pharmacist if you are not sure when to take your medications.   What Conditions does Welchol 625 Mg Tablet Treat? additional medication for diabetes type 2 inherited high blood cholesterol excessive fat in the blood    If you have lab work done today you will be contacted with your lab results within the next 2 weeks.   If you have not heard from [place] then please contact us. The fastest way to get your results is to register for My Chart.   IF you received an x-ray today, you will receive an invoice from [facility]. Please contact [facility] at [phone] with questions or concerns regarding your invoice.   IF you received labwork today, you will receive an invoice from [facility]. Please contact [facility] at [phone] with questions or concerns regarding your invoice.   Our billing staff will not be able to assist you with questions regarding bills from these companies.  You will be contacted with the lab results as soon as they are available. The fastest way to get your results is to activate your My Chart account. Instructions are located on the last page of this paperwork. If you have not heard from [place] regarding the results in 2 weeks, please contact this office.

## 2019-01-05 LAB — SPECIMEN STATUS

## 2019-01-06 LAB — CMP14+EGFR
ALT: 10 IU/L (ref 0–32)
AST: 18 IU/L (ref 0–40)
Albumin/Globulin Ratio: 1.5 (ref 1.2–2.2)
Albumin: 4.1 g/dL (ref 3.7–4.7)
Alkaline Phosphatase: 163 IU/L — ABNORMAL HIGH (ref 39–117)
BUN/Creatinine Ratio: 18 (ref 12–28)
BUN: 15 mg/dL (ref 8–27)
Bilirubin Total: 0.2 mg/dL (ref 0.0–1.2)
CO2: 23 mmol/L (ref 20–29)
Calcium: 9.7 mg/dL (ref 8.7–10.3)
Chloride: 102 mmol/L (ref 96–106)
Creatinine, Ser: 0.84 mg/dL (ref 0.57–1.00)
GFR calc Af Amer: 80 mL/min/{1.73_m2} (ref >59)
GFR calc non Af Amer: 69 mL/min/{1.73_m2} (ref >59)
Globulin, Total: 2.7 g/dL (ref 1.5–4.5)
Glucose: 104 mg/dL — ABNORMAL HIGH (ref 65–99)
Potassium: 3.9 mmol/L (ref 3.5–5.2)
Sodium: 144 mmol/L (ref 134–144)
Total Protein: 6.8 g/dL (ref 6.0–8.5)

## 2019-01-06 LAB — VITAMIN D 25 HYDROXY (VIT D DEFICIENCY, FRACTURES): Vit D, 25-Hydroxy: 43.4 ng/mL (ref 30.0–100.0)

## 2019-01-06 LAB — LIPID PANEL
Chol/HDL Ratio: 3.5 ratio (ref 0.0–4.4)
Cholesterol, Total: 266 mg/dL — ABNORMAL HIGH (ref 100–199)
HDL: 76 mg/dL (ref >39)
LDL Calculated: 177 mg/dL — ABNORMAL HIGH (ref 0–99)
Triglycerides: 66 mg/dL (ref 0–149)
VLDL Cholesterol Cal: 13 mg/dL (ref 5–40)

## 2019-01-06 LAB — TSH: TSH: 0.372 u[IU]/mL — ABNORMAL LOW (ref 0.450–4.500)

## 2019-01-08 MED ORDER — LEVOTHYROXINE SODIUM 50 MCG PO TABS: ORAL_TABLET | ORAL | 0 refills | Status: DC

## 2019-01-11 ENCOUNTER — Telehealth: Payer: Self-pay

## 2019-01-11 NOTE — Progress Notes (Signed)
Patient advised of MD response.  Appt scheduled.

## 2019-01-11 NOTE — Telephone Encounter (Signed)
Hi Dr. Nolon Rod,   I provided Ms. Isabella Berg with the instructions related to her labs.  Does she need a Rx for [employer]?   Thanks,  [employer]

## 2019-01-12 ENCOUNTER — Other Ambulatory Visit: Payer: Self-pay | Admitting: Family Medicine

## 2019-01-12 MED ORDER — COLESEVELAM HCL 625 MG PO TABS: 625.0000 mg | ORAL_TABLET | Freq: Two times a day (BID) | ORAL | 6 refills | Status: DC

## 2019-01-12 NOTE — Progress Notes (Signed)
Spoke with pt about lab results. Sent message to doctor for the welchol medication suggested

## 2019-01-20 ENCOUNTER — Other Ambulatory Visit: Payer: Self-pay | Admitting: Family Medicine

## 2019-01-20 DIAGNOSIS — E038 Other specified hypothyroidism: Secondary | ICD-10-CM

## 2019-02-04 ENCOUNTER — Other Ambulatory Visit: Payer: Self-pay | Admitting: Family Medicine

## 2019-02-04 DIAGNOSIS — I10 Essential (primary) hypertension: Secondary | ICD-10-CM

## 2019-04-08 ENCOUNTER — Ambulatory Visit (INDEPENDENT_AMBULATORY_CARE_PROVIDER_SITE_OTHER): Payer: Medicare Other | Admitting: Family Medicine

## 2019-04-08 VITALS — BP 139/83 | Ht 62.5 in | Wt 168.0 lb

## 2019-04-08 DIAGNOSIS — Z0000 Encounter for general adult medical examination without abnormal findings: Secondary | ICD-10-CM

## 2019-04-08 NOTE — Progress Notes (Signed)
Presents today for [employer] Visit   Date of last exam: 01/04/2019  Interpreter used for this visit?  No  I connected with  Isabella Berg on 04/08/19 by a telephone  and verified that I am speaking with the correct person using two identifiers.   I discussed the limitations of evaluation and management by telemedicine. The patient expressed understanding and agreed to proceed.   Patient Care Team: Forrest Moron, MD as PCP - General (Internal Medicine) Leanora Cover, MD as Consulting Physician (Orthopedic Surgery)   Other items to address today:  Discussed immunizations (does not have flu-shot) Discussed Eye/Dental Follow up with Dr. Nolon Rod  04-27-2019 9:40   Other Screening: Last screening for diabetes: 07/07/2018 Last lipid screening: 01/04/2019  ADVANCE DIRECTIVES: Discussed: yes On File: no Materials Provided:  Yes (mailed)  Immunization status:  Immunization History  Administered Date(s) Administered  . Td 04/07/2015     Health Maintenance Due  Topic Date Due  . COLONOSCOPY  06/17/2014  . INFLUENZA VACCINE  01/16/2019     Functional Status Survey: Is the patient deaf or have difficulty hearing?: No Does the patient have difficulty seeing, even when wearing glasses/contacts?: No Does the patient have difficulty concentrating, remembering, or making decisions?: No Does the patient have difficulty walking or climbing stairs?: No Does the patient have difficulty dressing or bathing?: No Does the patient have difficulty doing errands alone such as visiting a doctor's office or shopping?: No   6CIT Screen 04/08/2019  What Year? 0 points  What month? 0 points  What time? 0 points  Count back from 20 0 points  Months in reverse 0 points  Repeat phrase 0 points  Total Score 0        Clinical Support from 04/08/2019 in [facility]  AUDIT-C Score  0       Home Environment:   Lives in a two story home No  trouble climbing stairs No scattered rugs No grab bars Adequate lighting/no clutter   Patient Active Problem List   Diagnosis Date Noted  . Osteopenia 07/09/2017  . Overweight (BMI 25.0-29.9) 07/09/2017  . Prediabetes 07/09/2017  . Vitamin D deficiency 07/07/2017  . Hyperlipidemia 11/02/2015  . Benign essential HTN 08/17/2015  . Hypothyroidism 08/17/2015     Past Medical History:  Diagnosis Date  . Hyperlipidemia   . Hypertension   . Thyroid disease      Past Surgical History:  Procedure Laterality Date  . thyroid     Removal 2002     Family History  Problem Relation Age of Onset  . Hyperlipidemia Mother   . Cancer Father   . Hypertension Brother   . Stroke Brother   . Hyperlipidemia Brother      Social History   Socioeconomic History  . Marital status: Widowed    Spouse name: Not on file  . Number of children: Not on file  . Years of education: Not on file  . Highest education level: Not on file  Occupational History  . 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 Financial resource strain: Not on file  . Food insecurity    Worry: Not on file    Inability: Not on file  . Transportation needs    Medical: Not on file    Non-medical: Not on file  Tobacco Use  . Smoking status: Former Research scientist (life sciences)  . Smokeless tobacco:  Never Used  Substance and Sexual Activity  . Alcohol use: No  . Drug use: No  . Sexual activity: Not on file  Lifestyle  . Physical activity    Days per week: Not on file    Minutes per session: Not on file  . Stress: Not on file  Relationships  . Social Musician on phone: Not on file    Gets together: Not on file    Attends religious service: Not on file    Active member of club or organization: Not on file    Attends meetings of clubs or organizations: Not on file    Relationship status: Not on file  . Intimate partner violence    Fear of current or  ex partner: Not on file    Emotionally abused: Not on file    Physically abused: Not on file    Forced sexual activity: Not on file  Other Topics Concern  . Not on file  Social History Narrative  . Not on file     No Known Allergies   Prior to Admission medications   Medication Sig Start Date End Date Taking? Authorizing Provider  diltiazem (CARDIZEM CD) 180 MG 24 hr capsule TAKE 1 CAPSULE BY MOUTH EVERY DAY 07/07/18  Yes Stallings, Zoe A, MD  hydrochlorothiazide (HYDRODIURIL) 25 MG tablet TAKE 1 TABLET BY MOUTH EVERY DAY 02/04/19  Yes Stallings, Zoe A, MD  levothyroxine (SYNTHROID) 50 MCG tablet TAKE 1 TABLET BY MOUTH DAILY BEFORE BREAKFAST. TAKE 30-60 MINUTES BEFORE BREAKFAST. ON EMPTY STOMACH 01/08/19  Yes Stallings, Zoe A, MD  potassium chloride (KLOR-CON M10) 10 MEQ tablet TAKE 1 TABLET BY MOUTH EVERY DAY 07/07/18  Yes Doristine Bosworth, MD  colesevelam (WELCHOL) 625 MG tablet Take 1 tablet (625 mg total) by mouth 2 (two) times daily with a meal. Patient not taking: Reported on 04/08/2019 01/12/19   Doristine Bosworth, MD     Depression screen [facility] 2/9 04/08/2019 01/04/2019 07/07/2018 01/23/2018 07/07/2017  Decreased Interest 0 0 0 0 0  Down, Depressed, Hopeless 0 0 0 0 0  PHQ - 2 Score 0 0 0 0 0     Fall Risk  04/08/2019 01/04/2019 07/07/2018 01/23/2018 07/07/2017  Falls in the past year? 0 0 0 No No  Number falls in past yr: 0 0 - - -  Injury with Fall? 0 0 - - -  Follow up Falls evaluation completed;Education provided;Falls prevention discussed Falls evaluation completed - - -      PHYSICAL EXAM: BP 139/83 Comment: taken previous visit  Ht 5' 2.5" (1.588 m) Comment: per patient  Wt 168 lb (76.2 kg) Comment: taken from previous visit  BMI 30.24 kg/m    Wt Readings from Last 3 Encounters:  04/08/19 168 lb (76.2 kg)  01/04/19 168 lb 9.6 oz (76.5 kg)  07/07/18 166 lb 4 oz (75.4 kg)    Medicare annual wellness visit, subsequent    Education/Counseling provided regarding  diet and exercise, prevention of chronic diseases, smoking/tobacco cessation, if applicable, and reviewed "Covered Medicare Preventive Services."

## 2019-04-08 NOTE — Patient Instructions (Addendum)
Thank you for taking time to come for your Medicare Wellness Visit. I appreciate your ongoing commitment to your health goals. Please review the following plan we discussed and let me know if I can assist you in the future.    LPN  Preventive Care 74 Years and Older, Female Preventive care refers to lifestyle choices and visits with your health care provider that can promote health and wellness. This includes:  A yearly physical exam. This is also called an annual well check.  Regular dental and eye exams.  Immunizations.  Screening for certain conditions.  Healthy lifestyle choices, such as diet and exercise. What can I expect for my preventive care visit? Physical exam Your health care provider will check:  Height and weight. These may be used to calculate body mass index (BMI), which is a measurement that tells if you are at a healthy weight.  Heart rate and blood pressure.  Your skin for abnormal spots. Counseling Your health care provider may ask you questions about:  Alcohol, tobacco, and drug use.  Emotional well-being.  Home and relationship well-being.  Sexual activity.  Eating habits.  History of falls.  Memory and ability to understand (cognition).  Work and work environment.  Pregnancy and menstrual history. What immunizations do I need?  Influenza (flu) vaccine  This is recommended every year. Tetanus, diphtheria, and pertussis (Tdap) vaccine  You may need a Td booster every 10 years. Varicella (chickenpox) vaccine  You may need this vaccine if you have not already been vaccinated. Zoster (shingles) vaccine  You may need this after age 74. Pneumococcal conjugate (PCV13) vaccine  One dose is recommended after age 74. Pneumococcal polysaccharide (PPSV23) vaccine  One dose is recommended after age 74. Measles, mumps, and rubella (MMR) vaccine  You may need at least one dose of MMR if you were born in 1957 or later. You may also  need a second dose. Meningococcal conjugate (MenACWY) vaccine  You may need this if you have certain conditions. Hepatitis A vaccine  You may need this if you have certain conditions or if you travel or work in places where you may be exposed to hepatitis A. Hepatitis B vaccine  You may need this if you have certain conditions or if you travel or work in places where you may be exposed to hepatitis B. Haemophilus influenzae type b (Hib) vaccine  You may need this if you have certain conditions. You may receive vaccines as individual doses or as more than one vaccine together in one shot (combination vaccines). Talk with your health care provider about the risks and benefits of combination vaccines. What tests do I need? Blood tests  Lipid and cholesterol levels. These may be checked every 5 years, or more frequently depending on your overall health.  Hepatitis C test.  Hepatitis B test. Screening  Lung cancer screening. You may have this screening every year starting at age 74 if you have a 30-pack-year history of smoking and currently smoke or have quit within the past 15 years.  Colorectal cancer screening. All adults should have this screening starting at age 74 and continuing until age 75. Your health care provider may recommend screening at age 45 if you are at increased risk. You will have tests every 1-10 years, depending on your results and the type of screening test.  Diabetes screening. This is done by checking your blood sugar (glucose) after you have not eaten for a while (fasting). You may have this done every 1-3   years.  Mammogram. This may be done every 1-2 years. Talk with your health care provider about how often you should have regular mammograms.  BRCA-related cancer screening. This may be done if you have a family history of breast, ovarian, tubal, or peritoneal cancers. Other tests  Sexually transmitted disease (STD) testing.  Bone density scan. This is done  to screen for osteoporosis. You may have this done starting at age 74. Follow these instructions at home: Eating and drinking  Eat a diet that includes fresh fruits and vegetables, whole grains, lean protein, and low-fat dairy products. Limit your intake of foods with high amounts of sugar, saturated fats, and salt.  Take vitamin and mineral supplements as recommended by your health care provider.  Do not drink alcohol if your health care provider tells you not to drink.  If you drink alcohol: ? Limit how much you have to 0-1 drink a day. ? Be aware of how much alcohol is in your drink. In the [place] one drink equals one 12 oz bottle of beer (355 mL), one 5 oz glass of wine (148 mL), or one 1 oz glass of hard liquor (44 mL). Lifestyle  Take daily care of your teeth and gums.  Stay active. Exercise for at least 30 minutes on 5 or more days each week.  Do not use any products that contain nicotine or tobacco, such as cigarettes, e-cigarettes, and chewing tobacco. If you need help quitting, ask your health care provider.  If you are sexually active, practice safe sex. Use a condom or other form of protection in order to prevent STIs (sexually transmitted infections).  Talk with your health care provider about taking a low-dose aspirin or statin. What's next?  Go to your health care provider once a year for a well check visit.  Ask your health care provider how often you should have your eyes and teeth checked.  Stay up to date on all vaccines. This information is not intended to replace advice given to you by your health care provider. Make sure you discuss any questions you have with your health care provider. Document Released: 06/30/2015 Document Revised: 05/28/2018 Document Reviewed: 05/28/2018 Elsevier Patient Education  2020 [employer].

## 2019-04-12 ENCOUNTER — Ambulatory Visit: Payer: Self-pay | Admitting: Family Medicine

## 2019-04-27 ENCOUNTER — Ambulatory Visit: Payer: Medicare Other | Admitting: Family Medicine

## 2019-04-27 ENCOUNTER — Other Ambulatory Visit: Payer: Self-pay

## 2019-04-27 ENCOUNTER — Encounter: Payer: Self-pay | Admitting: Family Medicine

## 2019-04-27 VITALS — BP 125/75 | HR 77 | Temp 98.5°F | Wt 173.0 lb

## 2019-04-27 DIAGNOSIS — E559 Vitamin D deficiency, unspecified: Secondary | ICD-10-CM | POA: Diagnosis not present

## 2019-04-27 DIAGNOSIS — R7303 Prediabetes: Secondary | ICD-10-CM

## 2019-04-27 DIAGNOSIS — E0580 Other thyrotoxicosis without thyrotoxic crisis or storm: Secondary | ICD-10-CM

## 2019-04-27 DIAGNOSIS — I10 Essential (primary) hypertension: Secondary | ICD-10-CM

## 2019-04-27 DIAGNOSIS — E7800 Pure hypercholesterolemia, unspecified: Secondary | ICD-10-CM

## 2019-04-27 MED ORDER — COLESEVELAM HCL 625 MG PO TABS: 625.0000 mg | ORAL_TABLET | Freq: Two times a day (BID) | ORAL | 6 refills | Status: DC

## 2019-04-27 NOTE — Patient Instructions (Signed)
° ° ° °  If you have lab work done today you will be contacted with your lab results within the next 2 weeks.  If you have not heard from us then please contact us. The fastest way to get your results is to register for My Chart. ° ° °IF you received an x-ray today, you will receive an invoice from [facility]. Please contact  Radiology at [phone] with questions or concerns regarding your invoice.  ° °IF you received labwork today, you will receive an invoice from [facility]. Please contact [facility] at [phone] with questions or concerns regarding your invoice.  ° °Our billing staff will not be able to assist you with questions regarding bills from these companies. ° °You will be contacted with the lab results as soon as they are available. The fastest way to get your results is to activate your My Chart account. Instructions are located on the last page of this paperwork. If you have not heard from us regarding the results in 2 weeks, please contact this office. °  ° ° ° °

## 2019-04-27 NOTE — Progress Notes (Signed)
Established Patient Office Visit  Subjective:  Patient ID: Isabella Berg, female    DOB: 1945/07/16  Age: 73 y.o. MRN: [id number]  CC:  Chief Complaint  Patient presents with  . Medical Management of Chronic Issues    3 month f/u on chol/thyroid. Patient have not other issues at this time    HPI Isabella Berg presents for   Hypertension: Patient here for follow-up of elevated blood pressure. She is exercising by walking a small dog for 10 minutes tid and is adherent to low salt diet.  Blood pressure is well controlled at home. Cardiac symptoms none. Patient denies chest pain, chest pressure/discomfort, dyspnea, exertional chest pressure/discomfort, irregular heart beat, lower extremity edema, palpitations and paroxysmal nocturnal dyspnea.  Cardiovascular risk factors: dyslipidemia, hypertension and obesity (BMI >= 30 kg/m2). Use of agents associated with hypertension: none. History of target organ damage: none. BP Readings from Last 3 Encounters:  04/27/19 125/75  04/08/19 139/83  01/04/19 121/78   Hypothyroidism: Patient presents for evaluation of thyroid function. Her dose was decreased to levothyroxine . She is reporting weight gain but no diarrhea, palpitations, insomnia, fatigue. She is compliant with her medications   Lab Results  Component Value Date   TSH 0.372 (L) 01/04/2019   Obesity/Prediabetes Body mass index is 31.14 kg/m. Wt Readings from Last 3 Encounters:  04/27/19 173 lb (78.5 kg)  04/08/19 168 lb (76.2 kg)  01/04/19 168 lb 9.6 oz (76.5 kg)   She exercises by walking the dog She is avoiding certain food The weight does not seems to come off She feels like she is gaining weight She is also prediabetic Lab Results  Component Value Date   HGBA1C 6.0 (H) 07/07/2018    Dyslipidemia: Patient presents for evaluation of lipids.  She wanted to try supplements. Insurance would not cover welchol. She was only on the red yeast rice.  A repeat fasting  lipid profile was done.  The patient does use medications that may worsen dyslipidemias (corticosteroids, progestins, anabolic steroids, diuretics, beta-blockers, amiodarone, cyclosporine, olanzapine). The patient exercises daily.  The patient is not known to have coexisting coronary artery disease.   The 10-year ASCVD risk score Denman George DC Montez Hageman., et al., 2013) is: 19.9%   Values used to calculate the score:     Age: 52 years     Sex: Female     Is Non-Hispanic African American: Yes     Diabetic: No     Tobacco smoker: No     Systolic Blood Pressure: 125 mmHg     Is BP treated: Yes     HDL Cholesterol: 76 mg/dL     Total Cholesterol: 266 mg/dL    Past Medical History:  Diagnosis Date  . Hyperlipidemia   . Hypertension   . Thyroid disease     Past Surgical History:  Procedure Laterality Date  . thyroid     Removal 2002    Family History  Problem Relation Age of Onset  . Hyperlipidemia Mother   . Cancer Father   . Hypertension Brother   . Stroke Brother   . Hyperlipidemia Brother     Social History   Socioeconomic History  . Marital status: Widowed    Spouse name: Not on file  . Number of children: Not on file  . Years of education: Not on file  . Highest education level: Not on file  Occupational History  . 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 Financial resource strain: Not on file  . Food insecurity    Worry: Not on file    Inability: Not on file  . Transportation needs    Medical: Not on file    Non-medical: Not on file  Tobacco Use  . Smoking status: Former Research scientist (life sciences)  . Smokeless tobacco: Never Used  Substance and Sexual Activity  . Alcohol use: No  . Drug use: No  . Sexual activity: Not on file  Lifestyle  . Physical activity    Days per week: Not on file    Minutes per session: Not on file  . Stress: Not on file  Relationships  . Social Herbalist on phone: Not on  file    Gets together: Not on file    Attends religious service: Not on file    Active member of club or organization: Not on file    Attends meetings of clubs or organizations: Not on file    Relationship status: Not on file  . Intimate partner violence    Fear of current or ex partner: Not on file    Emotionally abused: Not on file    Physically abused: Not on file    Forced sexual activity: Not on file  Other Topics Concern  . Not on file  Social History Narrative  . Not on file    Outpatient Medications Prior to Visit  Medication Sig Dispense Refill  . colesevelam (WELCHOL) 625 MG tablet Take 1 tablet (625 mg total) by mouth 2 (two) times daily with a meal. 60 tablet 6  . diltiazem (CARDIZEM CD) 180 MG 24 hr capsule TAKE 1 CAPSULE BY MOUTH EVERY DAY 90 capsule 1  . hydrochlorothiazide (HYDRODIURIL) 25 MG tablet TAKE 1 TABLET BY MOUTH EVERY DAY 90 tablet 1  . levothyroxine (SYNTHROID) 50 MCG tablet TAKE 1 TABLET BY MOUTH DAILY BEFORE BREAKFAST. TAKE 30-60 MINUTES BEFORE BREAKFAST. ON EMPTY STOMACH 90 tablet 0  . potassium chloride (KLOR-CON M10) 10 MEQ tablet TAKE 1 TABLET BY MOUTH EVERY DAY 90 tablet 1   No facility-administered medications prior to visit.     No Known Allergies  ROS Review of Systems Review of Systems  Constitutional: Negative for activity change, appetite change, chills and fever.  HENT: Negative for congestion, nosebleeds, trouble swallowing and voice change.   Respiratory: Negative for cough, shortness of breath and wheezing.   Gastrointestinal: Negative for diarrhea, nausea and vomiting.  Genitourinary: Negative for difficulty urinating, dysuria, flank pain and hematuria.  Musculoskeletal: Negative for back pain, joint swelling and neck pain.  Neurological: Negative for dizziness, speech difficulty, light-headedness and numbness.  See HPI. All other review of systems negative.      Objective:    Physical Exam  BP 125/75 (BP Location: Right  Arm, Patient Position: Sitting, Cuff Size: Normal)   Pulse 77   Temp 98.5 F (36.9 C) (Oral)   Wt 173 lb (78.5 kg)   SpO2 97%   BMI 31.14 kg/m  Wt Readings from Last 3 Encounters:  04/27/19 173 lb (78.5 kg)  04/08/19 168 lb (76.2 kg)  01/04/19 168 lb 9.6 oz (76.5 kg)   Physical Exam  Constitutional: Oriented to person, place, and time. Appears well-developed and well-nourished.  HENT:  Head: Normocephalic and atraumatic.  Eyes: Conjunctivae and EOM are normal.  Neck: supple, no thyromegaly Cardiovascular: Normal rate, regular rhythm, normal heart sounds and intact distal pulses.  No murmur heard. Pulmonary/Chest: Effort normal and breath  sounds normal. No stridor. No respiratory distress. Has no wheezes.  Neurological: Is alert and oriented to person, place, and time.  Skin: Skin is warm. Capillary refill takes less than 2 seconds.  Psychiatric: Has a normal mood and affect. Behavior is normal. Judgment and thought content normal.     There are no preventive care reminders to display for this patient.  There are no preventive care reminders to display for this patient.  Lab Results  Component Value Date   TSH 0.372 (L) 01/04/2019   Lab Results  Component Value Date   WBC WILL FOLLOW 01/04/2019   HGB WILL FOLLOW 01/04/2019   HCT WILL FOLLOW 01/04/2019   MCV WILL FOLLOW 01/04/2019   PLT WILL FOLLOW 01/04/2019   Lab Results  Component Value Date   NA 144 01/04/2019   K 3.9 01/04/2019   CO2 23 01/04/2019   GLUCOSE 104 (H) 01/04/2019   BUN 15 01/04/2019   CREATININE 0.84 01/04/2019   BILITOT 0.2 01/04/2019   ALKPHOS 163 (H) 01/04/2019   AST 18 01/04/2019   ALT 10 01/04/2019   PROT 6.8 01/04/2019   ALBUMIN 4.1 01/04/2019   CALCIUM 9.7 01/04/2019   Lab Results  Component Value Date   CHOL 266 (H) 01/04/2019   Lab Results  Component Value Date   HDL 76 01/04/2019   Lab Results  Component Value Date   LDLCALC 177 (H) 01/04/2019   Lab Results  Component  Value Date   TRIG 66 01/04/2019   Lab Results  Component Value Date   CHOLHDL 3.5 01/04/2019   Lab Results  Component Value Date   HGBA1C 6.0 (H) 07/07/2018      Assessment & Plan:   Problem List Items Addressed This Visit      Cardiovascular and Mediastinum   Benign essential HTN - Patient's blood pressure is at goal of 139/89 or less. Condition is stable. Continue current medications and treatment plan. I recommend that you exercise for 30-45 minutes 5 days a week. I also recommend a balanced diet with fruits and vegetables every day, lean meats, and little fried foods. The DASH diet (you can find this online) is a good example of this.    Relevant Orders   Comprehensive metabolic panel   Lipid Panel     Other   Hyperlipidemia - unlikely that red yeast rice alone is sufficient goodrx has coupon for [facility] for a better price  Will send prescription there   Relevant Orders   Comprehensive metabolic panel   Lipid Panel   Vitamin D deficiency - will assess for stability   Relevant Orders   Vitamin D 25 (osteoporosis screening)   Prediabetes - discussed increasing her exercise    Relevant Orders   Hemoglobin A1c    Other Visit Diagnoses    Iatrogenic hyperthyroidism    -  Primary Discussed goal of tsh between 1 and 5 Discussed how thyroid fluctuations can impact her weight   Relevant Orders   TSH      No orders of the defined types were placed in this encounter.   Follow-up: No follow-ups on file.    Doristine BosworthZoe A , MD

## 2019-04-28 LAB — COMPREHENSIVE METABOLIC PANEL
ALT: 14 IU/L (ref 0–32)
AST: 14 IU/L (ref 0–40)
Albumin/Globulin Ratio: 1.4 (ref 1.2–2.2)
Albumin: 4 g/dL (ref 3.7–4.7)
Alkaline Phosphatase: 159 IU/L — ABNORMAL HIGH (ref 39–117)
BUN/Creatinine Ratio: 15 (ref 12–28)
BUN: 13 mg/dL (ref 8–27)
Bilirubin Total: 0.3 mg/dL (ref 0.0–1.2)
CO2: 24 mmol/L (ref 20–29)
Calcium: 9.2 mg/dL (ref 8.7–10.3)
Chloride: 102 mmol/L (ref 96–106)
Creatinine, Ser: 0.89 mg/dL (ref 0.57–1.00)
GFR calc Af Amer: 74 mL/min/{1.73_m2} (ref >59)
GFR calc non Af Amer: 64 mL/min/{1.73_m2} (ref >59)
Globulin, Total: 2.9 g/dL (ref 1.5–4.5)
Glucose: 92 mg/dL (ref 65–99)
Potassium: 3.8 mmol/L (ref 3.5–5.2)
Sodium: 142 mmol/L (ref 134–144)
Total Protein: 6.9 g/dL (ref 6.0–8.5)

## 2019-04-28 LAB — TSH: TSH: 12.3 u[IU]/mL — ABNORMAL HIGH (ref 0.450–4.500)

## 2019-04-28 LAB — HEMOGLOBIN A1C
Est. average glucose Bld gHb Est-mCnc: 128 mg/dL
Hgb A1c MFr Bld: 6.1 % — ABNORMAL HIGH (ref 4.8–5.6)

## 2019-04-28 LAB — LIPID PANEL
Chol/HDL Ratio: 3.6 ratio (ref 0.0–4.4)
Cholesterol, Total: 278 mg/dL — ABNORMAL HIGH (ref 100–199)
HDL: 78 mg/dL (ref >39)
LDL Chol Calc (NIH): 189 mg/dL — ABNORMAL HIGH (ref 0–99)
Triglycerides: 68 mg/dL (ref 0–149)
VLDL Cholesterol Cal: 11 mg/dL (ref 5–40)

## 2019-04-28 LAB — VITAMIN D 25 HYDROXY (VIT D DEFICIENCY, FRACTURES): Vit D, 25-Hydroxy: 32.6 ng/mL (ref 30.0–100.0)

## 2019-05-14 ENCOUNTER — Telehealth: Payer: Self-pay | Admitting: Family Medicine

## 2019-05-14 DIAGNOSIS — E038 Other specified hypothyroidism: Secondary | ICD-10-CM

## 2019-05-14 MED ORDER — LEVOTHYROXINE SODIUM 50 MCG PO TABS: ORAL_TABLET | ORAL | 0 refills | Status: DC

## 2019-05-14 MED ORDER — LEVOTHYROXINE SODIUM 75 MCG PO TABS: ORAL_TABLET | ORAL | 0 refills | Status: DC

## 2019-05-14 NOTE — Telephone Encounter (Signed)
Tried contacting pt but no answer and no vm prompt to leave message re: medication dosing for synthroid. Dgaddy, CMA

## 2019-05-14 NOTE — Telephone Encounter (Signed)
Please let the patient know that she should alternate doses of her thyroid medication. To prevent missing a dose she should use a pill box.  Sat, Sun, Tuesday and Thursday take 75 mcg and on M, W, Fri take 50 mcg.

## 2019-05-14 NOTE — Telephone Encounter (Signed)
-----   Message from [facility] [place] sent at 05/14/2019  2:26 PM EST ----- Spoke with pt advisedher thyroid is now in the hypothyroid range. The cholesterol levels are high. I will need to  Adjust her needs. Please schedule her for a follow up appointment. Pt scheduled for virtual visit on 05/17/19 and pt aware.  I advise would let dr Nolon Rod know and contact her back.  Per Taeshaun Rames will send in 30 day supply as medication (synthroid)may be adjusted.  Pt notified an agreeable.  Routed to Sharlotte Baka for review.

## 2019-05-17 ENCOUNTER — Telehealth (INDEPENDENT_AMBULATORY_CARE_PROVIDER_SITE_OTHER): Payer: Medicare Other | Admitting: Family Medicine

## 2019-05-17 ENCOUNTER — Other Ambulatory Visit: Payer: Self-pay

## 2019-05-17 DIAGNOSIS — E039 Hypothyroidism, unspecified: Secondary | ICD-10-CM | POA: Diagnosis not present

## 2019-05-17 NOTE — Progress Notes (Signed)
CC: medication adjustment to synthroid.  No travel [place] the [place] in the past 3 weeks.  No refills needed and no other concerns for provider today.

## 2019-05-17 NOTE — Patient Instructions (Signed)
     If you have lab work done today you will be contacted with your lab results within the next 2 weeks.  If you have not heard from us then please contact us. The fastest way to get your results is to register for My Chart.   IF you received an x-ray today, you will receive an invoice from [facility]. Please contact [facility] at [phone] with questions or concerns regarding your invoice.   IF you received labwork today, you will receive an invoice from [facility]. Please contact [facility] at [phone] with questions or concerns regarding your invoice.   Our billing staff will not be able to assist you with questions regarding bills from these companies.  You will be contacted with the lab results as soon as they are available. The fastest way to get your results is to activate your My Chart account. Instructions are located on the last page of this paperwork. If you have not heard from us regarding the results in 2 weeks, please contact this office.        If you have lab work done today you will be contacted with your lab results within the next 2 weeks.  If you have not heard from us then please contact us. The fastest way to get your results is to register for My Chart.   IF you received an x-ray today, you will receive an invoice from [facility]. Please contact [facility] at [phone] with questions or concerns regarding your invoice.   IF you received labwork today, you will receive an invoice from [facility]. Please contact [facility] at [phone] with questions or concerns regarding your invoice.   Our billing staff will not be able to assist you with questions regarding bills from these companies.  You will be contacted with the lab results as soon as they are available. The fastest way to get your results is to activate your My Chart account. Instructions are located on the last page of this paperwork. If you have not heard from us  regarding the results in 2 weeks, please contact this office.     

## 2019-05-17 NOTE — Progress Notes (Signed)
Telemedicine Encounter- SOAP NOTE Established Patient  This telephone encounter was conducted with the patient's (or proxy's) verbal consent via audio telecommunications: yes/no: Yes Patient was instructed to have this encounter in a suitably private space; and to only have persons present to whom they give permission to participate. In addition, patient identity was confirmed by use of name plus two identifiers (DOB and address).  I discussed the limitations, risks, security and privacy concerns of performing an evaluation and management service by telephone and the availability of in person appointments. I also discussed with the patient that there may be a patient responsible charge related to this service. The patient expressed understanding and agreed to proceed.  I spent a total of TIME; 0 MIN TO 60 MIN: 15 minutes talking with the patient or their proxy.  Chief Complaint  Patient presents with  . medicaton adjustment of synthroid    Subjective   Isabella Berg is a 73 y.o. established patient. Telephone visit today for  HPI   She admits to taking her thyroid medication She states that she was taking her medication by itself on an empty stomach She states that she is spacing out her thyroid medication by itself She is compliant with her meds She denies constipation, weakness, muscle fatigue.  Lab Results  Component Value Date   TSH 12.300 (H) 04/27/2019     Patient Active Problem List   Diagnosis Date Noted  . Osteopenia 07/09/2017  . Overweight (BMI 25.0-29.9) 07/09/2017  . Prediabetes 07/09/2017  . Vitamin D deficiency 07/07/2017  . Hyperlipidemia 11/02/2015  . Benign essential HTN 08/17/2015  . Hypothyroidism 08/17/2015    Past Medical History:  Diagnosis Date  . Hyperlipidemia   . Hypertension   . Thyroid disease     Current Outpatient Medications  Medication Sig Dispense Refill  . colesevelam (WELCHOL) 625 MG tablet Take 1 tablet (625 mg total) by  mouth 2 (two) times daily with a meal. 60 tablet 6  . diltiazem (CARDIZEM CD) 180 MG 24 hr capsule TAKE 1 CAPSULE BY MOUTH EVERY DAY 90 capsule 1  . hydrochlorothiazide (HYDRODIURIL) 25 MG tablet TAKE 1 TABLET BY MOUTH EVERY DAY 90 tablet 1  . levothyroxine (SYNTHROID) 50 MCG tablet MON, WED, FRI: TAKE 1 TABLET BY MOUTH DAILY BEFORE BREAKFAST. TAKE 30-60 MINUTES BEFORE BREAKFAST. ON EMPTY STOMACH 45 tablet 0  . levothyroxine (SYNTHROID) 75 MCG tablet ON SAT, SUN, TUE, THR: TAKE 1 TABLET BY MOUTH DAILY BEFORE BREAKFAST. TAKE 30-60 MINUTES BEFORE BREAKFAST. ON EMPTY STOMACH 45 tablet 0  . potassium chloride (KLOR-CON M10) 10 MEQ tablet TAKE 1 TABLET BY MOUTH EVERY DAY 90 tablet 1   No current facility-administered medications for this visit.     No Known Allergies  Social History   Socioeconomic History  . Marital status: Widowed    Spouse name: Not on file  . Number of children: Not on file  . Years of education: Not on file  . Highest education level: Not on file  Occupational History  . 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 Financial resource strain: Not on file  . Food insecurity    Worry: Not on file    Inability: Not on file  . Transportation needs    Medical: Not on file    Non-medical: Not on file  Tobacco Use  . Smoking status: Former Games developer  . Smokeless tobacco: Never Used  Substance and  Sexual Activity  . Alcohol use: No  . Drug use: No  . Sexual activity: Not on file  Lifestyle  . Physical activity    Days per week: Not on file    Minutes per session: Not on file  . Stress: Not on file  Relationships  . Social Herbalist on phone: Not on file    Gets together: Not on file    Attends religious service: Not on file    Active member of club or organization: Not on file    Attends meetings of clubs or organizations: Not on file    Relationship status: Not on file  . Intimate  partner violence    Fear of current or ex partner: Not on file    Emotionally abused: Not on file    Physically abused: Not on file    Forced sexual activity: Not on file  Other Topics Concern  . Not on file  Social History Narrative  . Not on file    ROS Review of Systems  Constitutional: Negative for activity change, appetite change, chills and fever.  HENT: Negative for congestion, nosebleeds, trouble swallowing and voice change.   Respiratory: Negative for cough, shortness of breath and wheezing.   Gastrointestinal: Negative for diarrhea, nausea and vomiting.  Genitourinary: Negative for difficulty urinating, dysuria, flank pain and hematuria.  Musculoskeletal: Negative for back pain, joint swelling and neck pain.  Neurological: Negative for dizziness, speech difficulty, light-headedness and numbness.  See HPI. All other review of systems negative.   Objective   Vitals as reported by the patient: There were no vitals filed for this visit. No exam performed Normal speech, normal pulmonary effort  Isabella Berg was seen today for medicaton adjustment of synthroid.  Diagnoses and all orders for this visit:  Acquired hypothyroidism -     TSH + free T4; Future   Pt advised to follow up in 3 months for lab visit for thyroid check  Take alternating 50mcg and 39mcg Reviewed how patient is taking medication correctly which is a change from how she took the medication previously She denies any symptoms    I discussed the assessment and treatment plan with the patient. The patient was provided an opportunity to ask questions and all were answered. The patient agreed with the plan and demonstrated an understanding of the instructions.   The patient was advised to call back or seek an in-person evaluation if the symptoms worsen or if the condition fails to improve as anticipated.  I provided 15 minutes of non-face-to-face time during this encounter.  Forrest Moron, MD  Primary  [facility] at [facility]

## 2019-05-20 NOTE — Telephone Encounter (Signed)
Per pt alternate thyroid medication dosing given to pt while in office for ov on 05/17/2019.  Pt verbalized understanding of new dosing of synthyroid 75 mcg and 50 mcg and what days to take each dosage.  Pt appreciative of f/u call regarding this matter.  Advised to contact office with any further questions or concerns she may have. Dgaddy, CMA

## 2019-05-21 ENCOUNTER — Telehealth: Payer: Self-pay | Admitting: Family Medicine

## 2019-05-21 NOTE — Progress Notes (Signed)
LVMTCB

## 2019-05-21 NOTE — Telephone Encounter (Signed)
Pt needs nurse visit in 3 months for thyroid per stallings. Lab ordered. lvmtcb

## 2019-08-02 ENCOUNTER — Other Ambulatory Visit: Payer: Self-pay | Admitting: Family Medicine

## 2019-08-02 DIAGNOSIS — E038 Other specified hypothyroidism: Secondary | ICD-10-CM

## 2019-08-02 MED ORDER — LEVOTHYROXINE SODIUM 50 MCG PO TABS: ORAL_TABLET | ORAL | 0 refills | Status: DC

## 2019-08-02 NOTE — Telephone Encounter (Signed)
Medication Refill - Medication: levothyroxine (SYNTHROID) 50 MCG tablet levothyroxine (SYNTHROID) 75 MCG tablet  Patient is all out of the medication   Preferred Pharmacy (with phone number or street name):  [id number] [address] Phone:  [phone]  Fax:  [phone]       Agent: Please be advised that RX refills may take up to 3 business days. We ask that you follow-up with your pharmacy.

## 2019-08-02 NOTE — Telephone Encounter (Signed)
Requested Prescriptions  Pending Prescriptions Disp Refills  . levothyroxine (SYNTHROID) 50 MCG tablet 45 tablet 0    Sig: MON, WED, FRI: TAKE 1 TABLET BY MOUTH DAILY BEFORE BREAKFAST. TAKE 30-60 MINUTES BEFORE BREAKFAST. ON EMPTY STOMACH     Endocrinology:  Hypothyroid Agents Failed - 08/02/2019  8:29 AM      Failed - TSH needs to be rechecked within 3 months after an abnormal result. Refill until TSH is due.      Failed - TSH in normal range and within 360 days    TSH  Date Value Ref Range Status  04/27/2019 12.300 (H) 0.450 - 4.500 uIU/mL Final         Passed - Valid encounter within last 12 months    Recent Outpatient Visits          2 months ago Acquired hypothyroidism   [facility] at [facility], Manus Rudd, MD   3 months ago Iatrogenic hyperthyroidism   Primary Care at [facility], Manus Rudd, MD   3 months ago Medicare annual wellness visit, subsequent   Primary [facility] at [facility], Meda Coffee, MD   7 months ago Vitamin D deficiency   Primary Care at [facility], Manus Rudd, MD   1 year ago Benign essential HTN   Primary Care at [facility], Manus Rudd, MD

## 2019-08-03 ENCOUNTER — Other Ambulatory Visit: Payer: Self-pay | Admitting: *Deleted

## 2019-08-03 ENCOUNTER — Telehealth: Payer: Self-pay | Admitting: Family Medicine

## 2019-08-03 DIAGNOSIS — E038 Other specified hypothyroidism: Secondary | ICD-10-CM

## 2019-08-03 MED ORDER — LEVOTHYROXINE SODIUM 75 MCG PO TABS: ORAL_TABLET | ORAL | 0 refills | Status: DC

## 2019-08-03 NOTE — Telephone Encounter (Signed)
Prescription sent

## 2019-08-03 NOTE — Telephone Encounter (Signed)
Pt called to request refill for synthroid 75 mcg. She received the 50 mcg but still requires the second dosage. Please assist Pharmacy [facility] [address]

## 2019-08-07 ENCOUNTER — Other Ambulatory Visit: Payer: Self-pay | Admitting: Family Medicine

## 2019-08-07 DIAGNOSIS — I10 Essential (primary) hypertension: Secondary | ICD-10-CM

## 2019-08-09 ENCOUNTER — Other Ambulatory Visit: Payer: Self-pay | Admitting: Family Medicine

## 2019-08-09 DIAGNOSIS — I10 Essential (primary) hypertension: Secondary | ICD-10-CM

## 2019-08-19 ENCOUNTER — Ambulatory Visit (INDEPENDENT_AMBULATORY_CARE_PROVIDER_SITE_OTHER): Payer: Medicare PPO | Admitting: Family Medicine

## 2019-08-19 ENCOUNTER — Other Ambulatory Visit: Payer: Self-pay

## 2019-08-19 DIAGNOSIS — E039 Hypothyroidism, unspecified: Secondary | ICD-10-CM

## 2019-08-19 NOTE — Progress Notes (Signed)
Lab only visit 

## 2019-08-20 LAB — TSH+FREE T4
Free T4: 1.31 ng/dL (ref 0.82–1.77)
TSH: 3.26 u[IU]/mL (ref 0.450–4.500)

## 2019-09-15 ENCOUNTER — Other Ambulatory Visit: Payer: Self-pay | Admitting: Family Medicine

## 2019-09-15 DIAGNOSIS — I10 Essential (primary) hypertension: Secondary | ICD-10-CM

## 2019-10-12 ENCOUNTER — Other Ambulatory Visit: Payer: Self-pay | Admitting: Family Medicine

## 2019-10-12 DIAGNOSIS — E038 Other specified hypothyroidism: Secondary | ICD-10-CM

## 2019-10-24 ENCOUNTER — Other Ambulatory Visit: Payer: Self-pay | Admitting: Family Medicine

## 2019-10-24 DIAGNOSIS — E038 Other specified hypothyroidism: Secondary | ICD-10-CM

## 2019-10-24 NOTE — Telephone Encounter (Signed)
Requested Prescriptions  Pending Prescriptions Disp Refills  . levothyroxine (SYNTHROID) 50 MCG tablet [Pharmacy Med Name: LEVOTHYROXINE 50 MCG TABLET] 45 tablet 3    Sig: MON, WED, FRI: TAKE 1 TAB BY MOUTH DAILY 30-60 MINS BEFORE BREAKFAST ON EMPTY STOMACH     Endocrinology:  Hypothyroid Agents Failed - 10/24/2019 12:52 AM      Failed - TSH needs to be rechecked within 3 months after an abnormal result. Refill until TSH is due.      Passed - TSH in normal range and within 360 days    TSH  Date Value Ref Range Status  08/19/2019 3.260 0.450 - 4.500 uIU/mL Final         Passed - Valid encounter within last 12 months    Recent Outpatient Visits          2 months ago Acquired hypothyroidism   [facility] at [facility], Meda Coffee, MD   5 months ago Acquired hypothyroidism   Primary Care at [facility], Manus Rudd, MD   6 months ago Iatrogenic hyperthyroidism   Primary Care at [facility], Manus Rudd, MD   6 months ago Medicare annual wellness visit, subsequent   Primary Care at [facility], Meda Coffee, MD   9 months ago Vitamin D deficiency   Primary Care at [facility], Manus Rudd, MD

## 2019-10-29 ENCOUNTER — Other Ambulatory Visit: Payer: Self-pay | Admitting: Family Medicine

## 2019-10-29 DIAGNOSIS — I10 Essential (primary) hypertension: Secondary | ICD-10-CM

## 2019-10-29 NOTE — Telephone Encounter (Signed)
Requested medication (s) are due for refill today: Yes  Requested medication (s) are on the active medication list: Yes  Last refill:  07/07/18  Future visit scheduled: No  Notes to clinic:  Prescription has expired.    Requested Prescriptions  Pending Prescriptions Disp Refills   KLOR-CON M10 10 MEQ tablet [Pharmacy Med Name: KLOR-CON M10 TABLET] 90 tablet 1    Sig: TAKE 1 TABLET BY MOUTH EVERY DAY      Endocrinology:  Minerals - Potassium Supplementation Passed - 10/29/2019 12:37 PM      Passed - K in normal range and within 360 days    Potassium  Date Value Ref Range Status  04/27/2019 3.8 3.5 - 5.2 mmol/L Final          Passed - Cr in normal range and within 360 days    Creat  Date Value Ref Range Status  11/02/2015 0.69 0.60 - 0.93 mg/dL Final   Creatinine, Ser  Date Value Ref Range Status  04/27/2019 0.89 0.57 - 1.00 mg/dL Final          Passed - Valid encounter within last 12 months    Recent Outpatient Visits           2 months ago Acquired hypothyroidism   [facility], Meda Coffee, MD   5 months ago Acquired hypothyroidism   Primary Care at [facility], Manus Rudd, MD   6 months ago Iatrogenic hyperthyroidism   [facility] at [facility], Manus Rudd, MD   6 months ago Medicare annual wellness visit, subsequent   [facility], Meda Coffee, MD   9 months ago Vitamin D deficiency   Primary Care at [facility], Manus Rudd, MD

## 2020-01-18 ENCOUNTER — Other Ambulatory Visit: Payer: Self-pay | Admitting: Family Medicine

## 2020-01-18 DIAGNOSIS — E038 Other specified hypothyroidism: Secondary | ICD-10-CM

## 2020-01-18 MED ORDER — LEVOTHYROXINE SODIUM 75 MCG PO TABS: ORAL_TABLET | ORAL | 1 refills | Status: DC

## 2020-01-18 MED ORDER — LEVOTHYROXINE SODIUM 50 MCG PO TABS: ORAL_TABLET | ORAL | 1 refills | Status: DC

## 2020-01-18 NOTE — Telephone Encounter (Signed)
RX REFILL levothyroxine (SYNTHROID) 50 MCG tablet  levothyroxine (SYNTHROID) 75 MCG tablet [[id number]]  PHARMACY [id number] [place], [place] - [address] Phone:  [phone]  Fax:  [phone]

## 2020-03-15 ENCOUNTER — Other Ambulatory Visit: Payer: Self-pay | Admitting: Family Medicine

## 2020-03-15 DIAGNOSIS — I10 Essential (primary) hypertension: Secondary | ICD-10-CM

## 2020-03-15 NOTE — Telephone Encounter (Signed)
Medication Refill - Medication: diltiazem (CARDIZEM CD) 180 MG 24 hr capsule    Preferred Pharmacy (with phone number or street name):  [id number] [address] Phone:  [phone]  Fax:  [phone]       Agent: Please be advised that RX refills may take up to 3 business days. We ask that you follow-up with your pharmacy.

## 2020-03-15 NOTE — Telephone Encounter (Signed)
Requested medication (s) are due for refill today: yes  Requested medication (s) are on the active medication list: yes   Last refill:  08/09/19  #90 1 refill  Future visit scheduled: No Please schedule OV  Notes to clinic:Patient needs OV for refill    Requested Prescriptions  Pending Prescriptions Disp Refills   diltiazem (CARDIZEM CD) 180 MG 24 hr capsule      Sig: Take by mouth daily.      Cardiovascular:  Calcium Channel Blockers Failed - 03/15/2020 12:53 PM      Failed - Valid encounter within last 6 months    Recent Outpatient Visits           6 months ago Acquired hypothyroidism   [facility], Meda Coffee, MD   10 months ago Acquired hypothyroidism   [facility] at [facility], Manus Rudd, MD   10 months ago Iatrogenic hyperthyroidism   Primary Care at [facility], Manus Rudd, MD   11 months ago Medicare annual wellness visit, subsequent   [facility], Meda Coffee, MD   1 year ago Vitamin D deficiency   Primary [facility] at [facility], [place] A, MD              Passed - Last BP in normal range    BP Readings from Last 1 Encounters:  04/27/19 125/75

## 2020-03-16 MED ORDER — DILTIAZEM HCL ER COATED BEADS 180 MG PO CP24: 180.0000 mg | ORAL_CAPSULE | Freq: Every day | ORAL | 0 refills | Status: DC

## 2020-03-16 NOTE — Telephone Encounter (Signed)
03/16/2020 - PATIENT REQUESTING A REFILL ON HER DILTIAZEM (CARDIZEM CD) 180 mg. I HAVE SCHEDULED A TRANSFER OF CARE FROM DR. STALLINGS TO [name] JUST ON Thursday 04/20/2020 AT 1:00 pm. Isabella K. HAS GIVEN HER A 30 DAY SUPPLY UNTIL HER APPOINTMENT. I DO NOT HAVE TO ROUTE MESSAGE BACK TO THE CLINICAL TEAM AT THIS TIME. MBC

## 2020-03-16 NOTE — Telephone Encounter (Signed)
No further refills without office visit 

## 2020-03-16 NOTE — Telephone Encounter (Signed)
Please schedule TOC/med refills. 30 day supply has been sent into the pharmacy.

## 2020-04-13 ENCOUNTER — Other Ambulatory Visit: Payer: Self-pay

## 2020-04-13 ENCOUNTER — Telehealth: Payer: Self-pay | Admitting: Family Medicine

## 2020-04-13 DIAGNOSIS — I10 Essential (primary) hypertension: Secondary | ICD-10-CM

## 2020-04-13 MED ORDER — DILTIAZEM HCL ER COATED BEADS 180 MG PO CP24: 180.0000 mg | ORAL_CAPSULE | Freq: Every day | ORAL | 0 refills | Status: DC

## 2020-04-13 MED ORDER — HYDROCHLOROTHIAZIDE 25 MG PO TABS: 25.0000 mg | ORAL_TABLET | Freq: Every day | ORAL | 1 refills | Status: DC

## 2020-04-13 NOTE — Telephone Encounter (Signed)
These have been refilled. 

## 2020-04-13 NOTE — Telephone Encounter (Signed)
Pt called and is needing a med refill on these Rx listed below to get her to her toc appt with FNP Just on 04/20/20.   hydrochlorothiazide (HYDRODIURIL) 25 MG tablet [[id number]]   diltiazem (CARDIZEM CD) 180 MG 24 hr capsule [[id number]]    [id number] [place], [place] - [address]

## 2020-04-20 ENCOUNTER — Ambulatory Visit (INDEPENDENT_AMBULATORY_CARE_PROVIDER_SITE_OTHER): Payer: Medicare PPO

## 2020-04-20 ENCOUNTER — Encounter: Payer: Self-pay | Admitting: Family Medicine

## 2020-04-20 ENCOUNTER — Ambulatory Visit: Payer: Medicare PPO | Admitting: Family Medicine

## 2020-04-20 ENCOUNTER — Other Ambulatory Visit: Payer: Self-pay

## 2020-04-20 VITALS — BP 132/82 | HR 73 | Temp 97.5°F | Ht 62.5 in | Wt 160.0 lb

## 2020-04-20 DIAGNOSIS — Z634 Disappearance and death of family member: Secondary | ICD-10-CM

## 2020-04-20 DIAGNOSIS — F4321 Adjustment disorder with depressed mood: Secondary | ICD-10-CM

## 2020-04-20 DIAGNOSIS — E7800 Pure hypercholesterolemia, unspecified: Secondary | ICD-10-CM

## 2020-04-20 DIAGNOSIS — M21932 Unspecified acquired deformity of left forearm: Secondary | ICD-10-CM | POA: Diagnosis not present

## 2020-04-20 DIAGNOSIS — M21931 Unspecified acquired deformity of right forearm: Secondary | ICD-10-CM

## 2020-04-20 DIAGNOSIS — M8580 Other specified disorders of bone density and structure, unspecified site: Secondary | ICD-10-CM

## 2020-04-20 DIAGNOSIS — I10 Essential (primary) hypertension: Secondary | ICD-10-CM | POA: Diagnosis not present

## 2020-04-20 DIAGNOSIS — E559 Vitamin D deficiency, unspecified: Secondary | ICD-10-CM | POA: Diagnosis not present

## 2020-04-20 DIAGNOSIS — B372 Candidiasis of skin and nail: Secondary | ICD-10-CM

## 2020-04-20 DIAGNOSIS — E2839 Other primary ovarian failure: Secondary | ICD-10-CM

## 2020-04-20 DIAGNOSIS — E038 Other specified hypothyroidism: Secondary | ICD-10-CM

## 2020-04-20 DIAGNOSIS — G5603 Carpal tunnel syndrome, bilateral upper limbs: Secondary | ICD-10-CM

## 2020-04-20 IMAGING — DX DG WRIST COMPLETE 3+V*L*
4 series · 4 of 4 positions shown · non-contrast
Comparison: None.

CLINICAL DATA: 74-year-old female with wrist deformities.

EXAM:
LEFT WRIST - COMPLETE 3+ VIEW

[wrist pa]
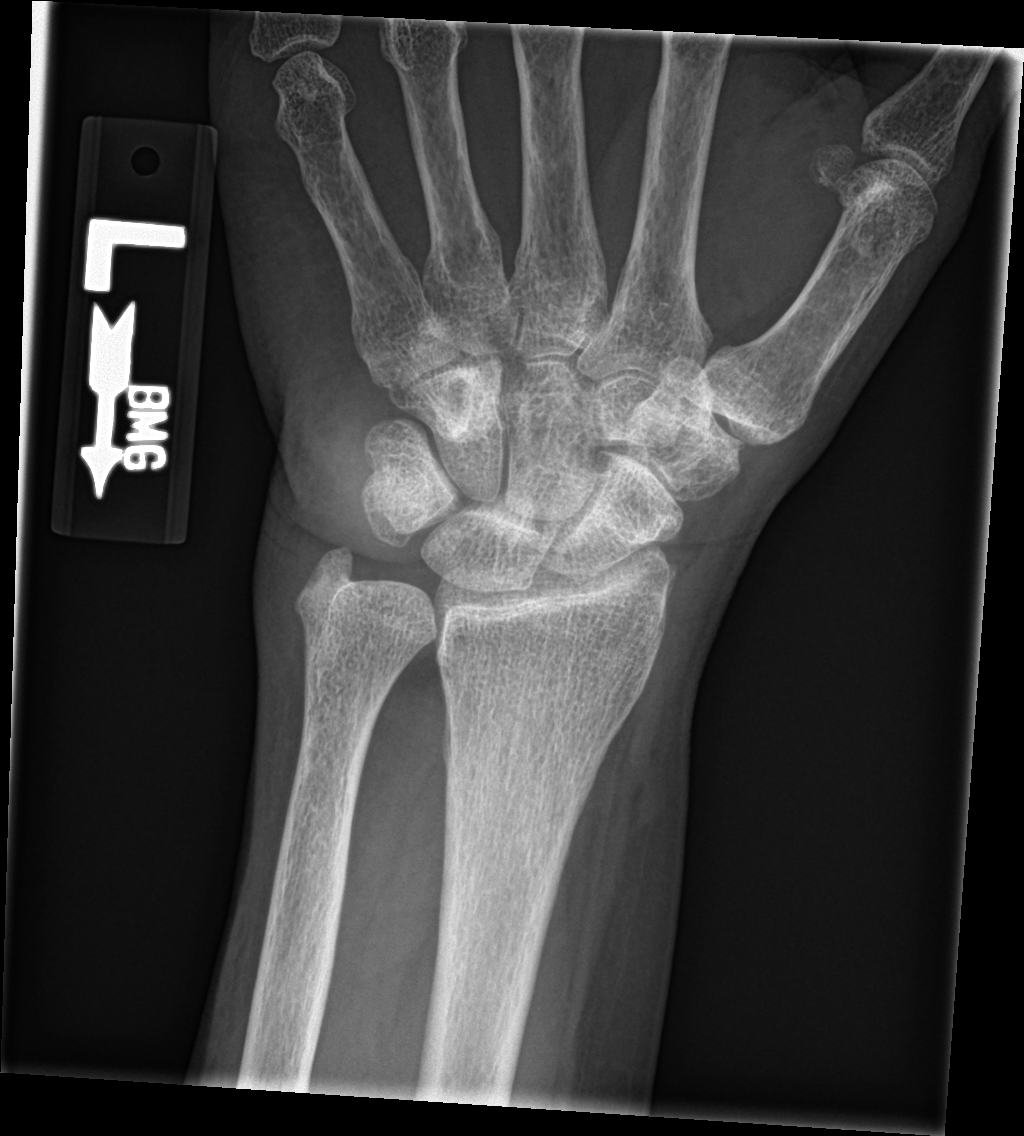

[wrist obl]
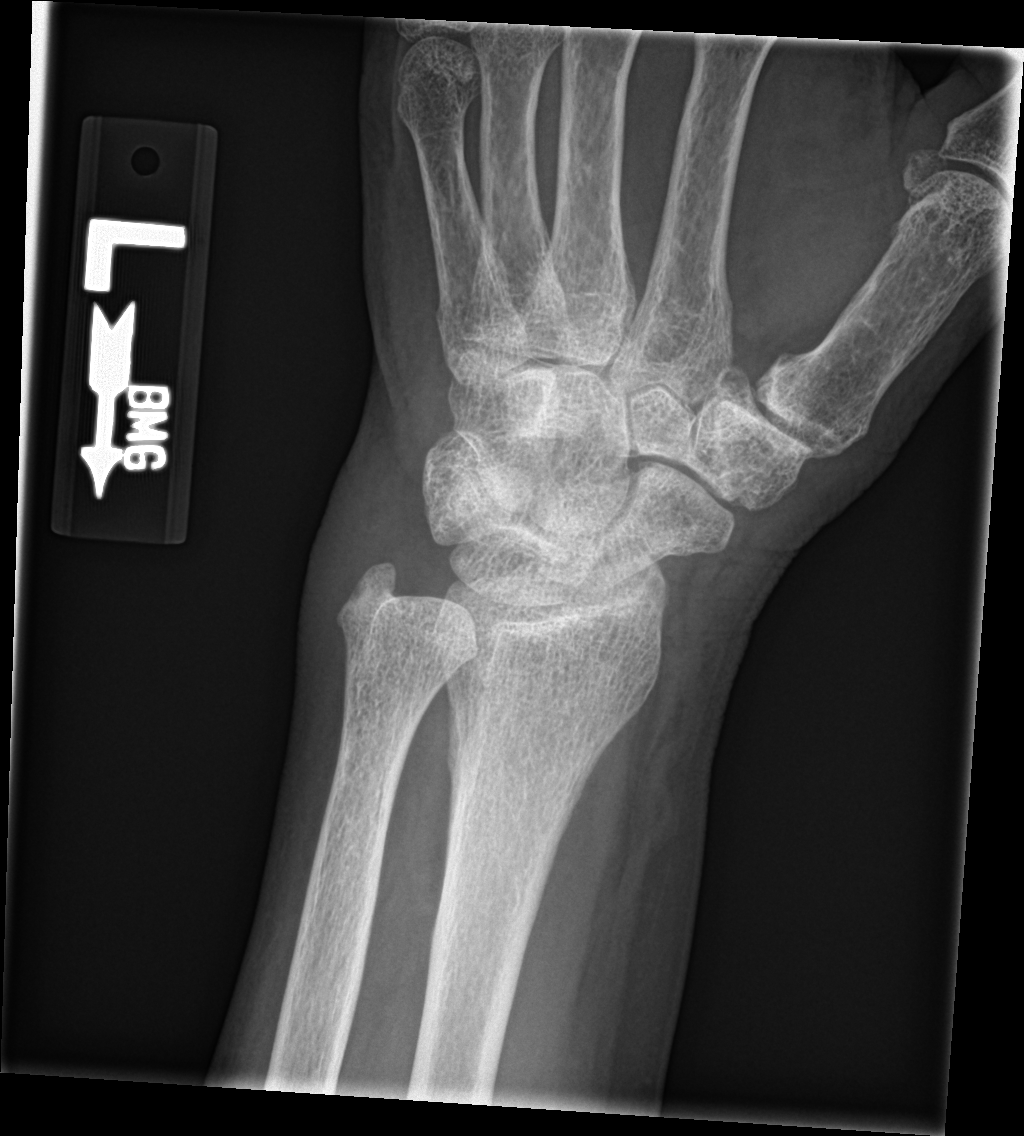

[wrist lat]
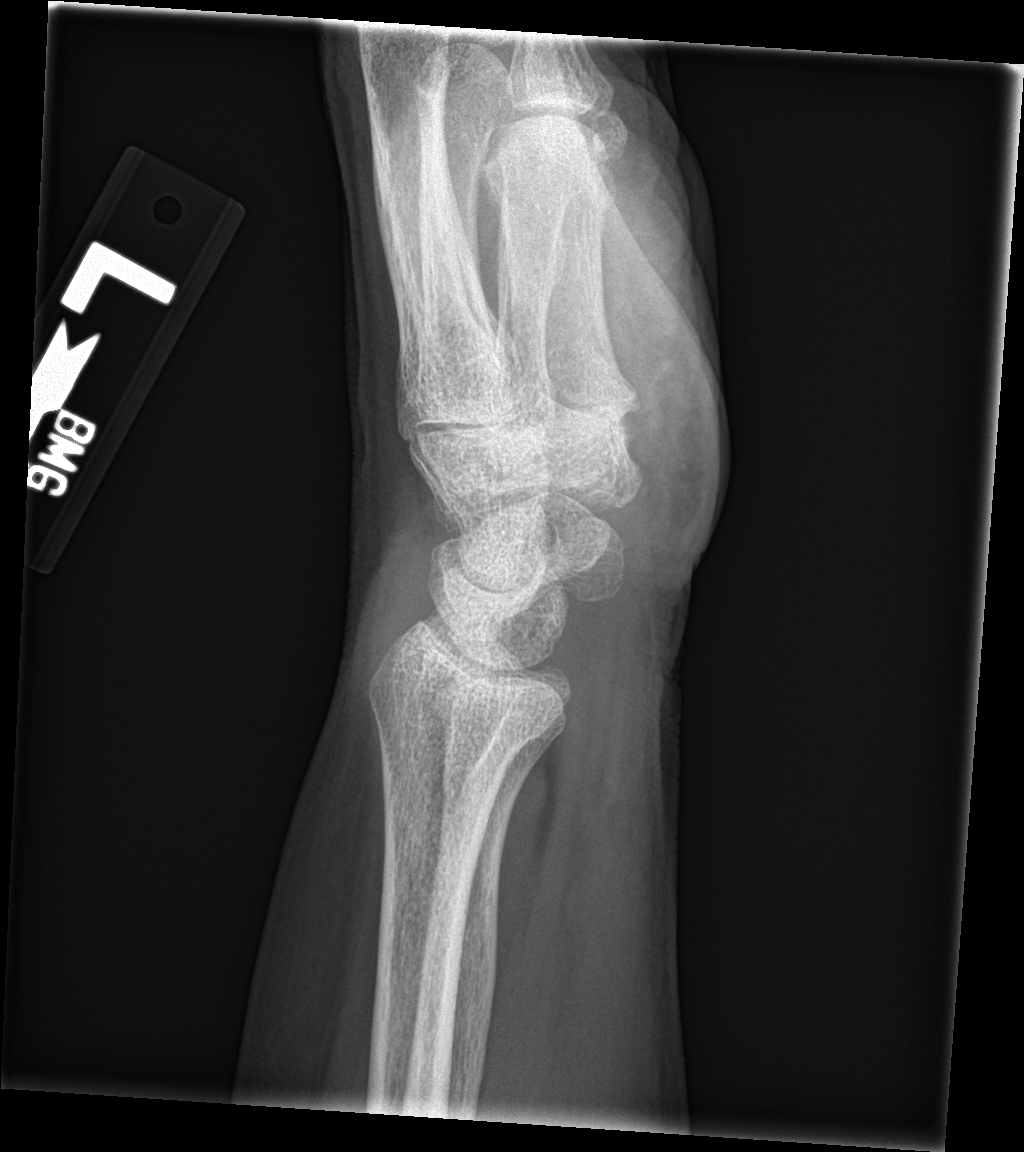

[wrist navicular]
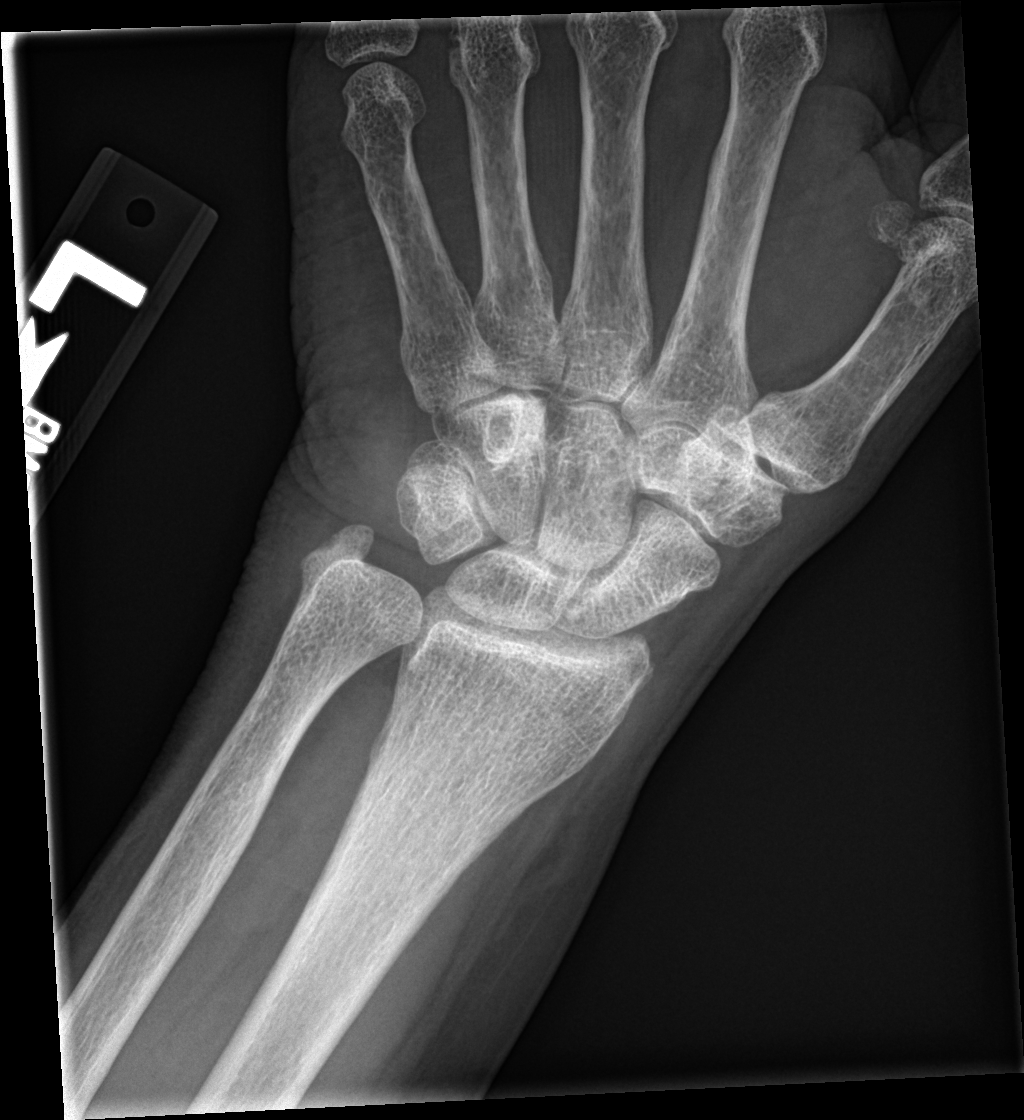

[4 of 4 positions shown; findings below may reference images not displayed]

FINDINGS: Bone mineralization is within normal limits for age. Distal radius
and ulna are intact. Carpal bones appear intact and normally
aligned. Possible healed deformity of the distal shaft left 5th
metacarpal. Other visible metacarpals appear intact. No acute
osseous abnormality identified.

There is volar soft tissue swelling and soft tissue irregularity at
the level of the distal carpal row. No radiopaque foreign body
identified.
IMPRESSION: Volar soft tissue swelling/injury. No acute fracture or dislocation
identified about the left wrist.

## 2020-04-20 MED ORDER — LEVOTHYROXINE SODIUM 75 MCG PO TABS: ORAL_TABLET | ORAL | 3 refills | Status: AC

## 2020-04-20 MED ORDER — NYSTATIN 100000 UNIT/GM EX OINT: 1.0000 "application " | TOPICAL_OINTMENT | Freq: Two times a day (BID) | CUTANEOUS | 0 refills | Status: AC

## 2020-04-20 MED ORDER — HYDROCHLOROTHIAZIDE 25 MG PO TABS: 25.0000 mg | ORAL_TABLET | Freq: Every day | ORAL | 3 refills | Status: AC

## 2020-04-20 MED ORDER — DILTIAZEM HCL ER COATED BEADS 180 MG PO CP24: 180.0000 mg | ORAL_CAPSULE | Freq: Every day | ORAL | 3 refills | Status: AC

## 2020-04-20 MED ORDER — LEVOTHYROXINE SODIUM 50 MCG PO TABS: ORAL_TABLET | ORAL | 3 refills | Status: AC

## 2020-04-20 NOTE — Progress Notes (Signed)
11/4/20212:08 PM  Isabella Berg 1946-04-17, 74 y.o., female [id number]  Chief Complaint  Patient presents with  . Hypothyroidism    labs and med refills  . loss/ grief    lost daughter 2 months ago- counseling request  . Hypertension    med refill   . Transitions Of Care   HPI:  Patient is a 74 y.o. female with past medical history significant for hypothyroid, HTN who presents today for TOC.   Daughter recently passed away from cancer Breast cancer died age 18 mets to lungs   Hypertension: Patient here for follow-up of elevated blood pressure. She is not exercising and is adherent to low salt diet.  Blood pressure is well controlled at home. Cardiac symptoms none. Patient denies chest pressure/discomfort, claudication, exertional chest pressure/discomfort, fatigue, irregular heart beat and lower extremity edema.  Cardiovascular risk factors: dyslipidemia, hypertension and sedentary lifestyle. Use of agents associated with hypertension: thyroid hormones. History of target organ damage: none. BP Readings from Last 3 Encounters:  04/20/20 132/82  04/27/19 125/75  04/08/19 139/83     Thyroid: Patient presents for evaluation of hypothyroidism. Current symptoms include denies fatigue, weight changes, heat/cold intolerance, bowel/skin changes or CVS symptoms. Patient denies denies fatigue, weight changes, heat/cold intolerance, bowel/skin changes or CVS symptoms.  She takes levothyroxine 27mg vs 534m  Dyslipidemia: Patient presents for evaluation of lipids. She does not take a statin due to bad reactions and pain from statins.  She eats a healthy diet.   A repeat fasting lipid profile was done.  The patient does use medications that may worsen dyslipidemias (corticosteroids, progestins, anabolic steroids, diuretics, beta-blockers, amiodarone, cyclosporine, olanzapine). The patient exercises intermittently.  The patient is not known to have coexisting coronary artery disease.    Vitamin D deficiency  Pt reports that she was not taking any vitamin D She did not know why kind to get over the counter She denies bone pain but has some fatigue She had complete thyroidectomy due to goiter but denies history of kidney disease or parathyroidism  Screening: Covid: decline Flu: decline Mammogram: Lst 08/01/2017, needs Dexa: 08/01/2017 Colon Cancer: Unsure last, no longer wants Pap: Unsure, no longer wants   [facility] 05/17/2019 04/27/2019 04/08/2019  Decreased Interest 0 0 0  Down, Depressed, Hopeless 0 0 0  PHQ - 2 Score 0 0 0    Fall Risk  04/20/2020 05/17/2019 04/27/2019 04/08/2019 01/04/2019  Falls in the past year? 0 0 0 0 0  Number falls in past yr: 0 0 0 0 0  Injury with Fall? 0 0 - 0 0  Follow up Falls evaluation completed Falls evaluation completed Falls evaluation completed Falls evaluation completed;Education provided;Falls prevention discussed Falls evaluation completed     No Known Allergies  Prior to Admission medications   Medication Sig Start Date End Date Taking? Authorizing Provider  cholecalciferol (VITAMIN D3) 25 MCG (1000 UNIT) tablet Take 1,000 Units by mouth daily.   Yes [provider]  diltiazem (CARDIZEM CD) 180 MG 24 hr capsule Take 1 capsule (180 mg total) by mouth daily. 04/13/20  Yes Yasmeen Manka, KeLaurita QuintFNP  hydrochlorothiazide (HYDRODIURIL) 25 MG tablet Take 1 tablet (25 mg total) by mouth daily. 04/13/20  Yes Tiyana Galla, KeLaurita QuintFNP  levothyroxine (SYNTHROID) 50 MCG tablet MON, WED, FRI: TAKE 1 TAB BY MOUTH DAILY 30-60 MINS BEFORE BREAKFAST ON EMPTY STOMACH TOC  APPT REQUIRED , PLEASE CALL OFFICE 01/18/20  Yes SaRutherford GuysMD  levothyroxine (SYNTHROID) 75 MCG  tablet ON SAT, SUN, TUE, THR: TAKE 1 TAB BY MOUTH DAILY 30-60 MINUTES BEFORE BREAKFAST ON EMPTY STOMACH PLEASE CALL TO SET APPOINTMENT TO ESTABLISH WITH NEW PCP 01/18/20  Yes Rutherford Guys, MD  potassium chloride (KLOR-CON M10) 10 MEQ tablet TAKE 1 TABLET  BY MOUTH EVERY DAY 10/29/19  Yes Forrest Moron, MD    Past Medical History:  Diagnosis Date  . Hyperlipidemia   . Hypertension   . Thyroid disease     Past Surgical History:  Procedure Laterality Date  . thyroid     Removal 2002    Social History   Tobacco Use  . Smoking status: Former Research scientist (life sciences)  . Smokeless tobacco: Never Used  Substance Use Topics  . Alcohol use: No    Family History  Problem Relation Age of Onset  . Hyperlipidemia Mother   . Cancer Father   . Hypertension Brother   . Stroke Brother   . Hyperlipidemia Brother     Review of Systems  Constitutional: Negative for chills, fever and malaise/fatigue.  Eyes: Negative for blurred vision, double vision and pain.  Respiratory: Negative for cough, shortness of breath and wheezing.   Cardiovascular: Negative for chest pain, palpitations and leg swelling.  Gastrointestinal: Negative for abdominal pain, blood in stool, constipation, diarrhea, heartburn, nausea and vomiting.  Genitourinary: Negative for dysuria, frequency and hematuria.  Musculoskeletal: Negative for back pain and joint pain.       Deformities to bilateral wrists  Skin: Negative for rash.  Neurological: Positive for tingling (Hands, prev. diagnosis carpal tunnel). Negative for dizziness, sensory change, focal weakness, weakness and headaches.  Endo/Heme/Allergies: Does not bruise/bleed easily.  Psychiatric/Behavioral: Positive for depression. Negative for suicidal ideas.     OBJECTIVE:  Today's Vitals   04/20/20 1256  BP: 132/82  Pulse: 73  Temp: (!) 97.5 F (36.4 C)  SpO2: 98%  Weight: 160 lb (72.6 kg)  Height: 5' 2.5" (1.588 m)   Body mass index is 28.8 kg/m.   Physical Exam Constitutional:      General: She is not in acute distress.    Appearance: Normal appearance. She is not ill-appearing.  HENT:     Head: Normocephalic.  Cardiovascular:     Rate and Rhythm: Normal rate and regular rhythm.     Pulses: Normal pulses.      Heart sounds: Normal heart sounds. No murmur heard.  No friction rub. No gallop.   Pulmonary:     Effort: Pulmonary effort is normal. No respiratory distress.     Breath sounds: Normal breath sounds. No stridor. No wheezing, rhonchi or rales.  Abdominal:     General: Bowel sounds are normal.     Palpations: Abdomen is soft.     Tenderness: There is no abdominal tenderness.  Musculoskeletal:        General: Deformity (Bilateral wrists) present.     Right lower leg: No edema.     Left lower leg: No edema.  Skin:    General: Skin is warm and dry.     Findings: Rash (groin) present.  Neurological:     Mental Status: She is alert and oriented to person, place, and time.  Psychiatric:        Mood and Affect: Mood normal.        Behavior: Behavior normal.     No results found for this or any previous visit (from the past 24 hour(s)).  No results found.   ASSESSMENT and PLAN  Problem List Items  Addressed This Visit      Cardiovascular and Mediastinum   Benign essential HTN   Relevant Medications   hydrochlorothiazide (HYDRODIURIL) 25 MG tablet   diltiazem (CARDIZEM CD) 180 MG 24 hr capsule At goal< 140/90 Stable on current regimen,  Will continue to monitor. BP Readings from Last 3 Encounters:  04/20/20 132/82  04/27/19 125/75  04/08/19 139/83      Other Relevant Orders   Hemoglobin A1c   CMP14+EGFR   CBC     Endocrine   Hypothyroidism   Relevant Medications   levothyroxine (SYNTHROID) 75 MCG tablet   levothyroxine (SYNTHROID) 50 MCG tablet Stable on current regimen will follow up wit lab results   Other Relevant Orders   TSH     Musculoskeletal and Integument   Osteopenia     Other   Hyperlipidemia - Primary   Relevant Medications   hydrochlorothiazide (HYDRODIURIL) 25 MG tablet   diltiazem (CARDIZEM CD) 180 MG 24 hr capsule   Other Relevant Orders   Lipid Panel   Vitamin D deficiency   Relevant Orders   Vitamin D, 25-hydroxy Stable on current  regimen will follow up wit lab results    Other Visit Diagnoses    Estrogen deficiency       Relevant Orders   DG Bone Density   Right wrist deformity       Relevant Orders   DG Wrist Complete Right   AMB referral to orthopedics   ANA   Bilateral carpal tunnel syndrome       Relevant Orders   AMB referral to orthopedics   Deformity of left wrist       Relevant Orders   AMB referral to orthopedics   DG Wrist Complete Left   ANA   Candida infection of flexural skin : Groin   Relevant Medications   nystatin ointment (MYCOSTATIN) Good hygiene educated, keeping area dry.   Grief at loss of child     For therapy -- 1. [facility] ([place], [place], Karla Safford) [phone] 2. [place] [facility] [place]) [phone] 3. [place] [facility] - [phone] 4. [facility] - [phone] (do not file insurance) 5. [facility] accepts most major insurances. [phone] or email [email or web address]      Screenings: Needs to schedule mammogram and DEXA Declines all immunizations. Will follow up with lab results.  Return in about 6 months (around 10/18/2020).    Huston Foley Nathanie Ottley, FNP-BC Primary Care at [facility] [place], [place] [address] Ph.  [phone] Fax [phone]

## 2020-04-20 NOTE — Patient Instructions (Addendum)
For therapy -- 1. [facility] ([address] [place] Amato [place] Joycelyn [place]) [phone] 2. [facility] Raynelle Fanning [place]) [phone] 3. [facility] - [phone] 4. [facility] - [phone] (do not file insurance) 5. [facility] - accepts most major insurances. [phone] or email [email or web address]  We recommend that you schedule a mammogram for breast cancer screening. Typically, you do not need a referral to do this. Please contact a local imaging center to schedule your mammogram.  [place] [facility]  [facility] - [phone]  *ask for the Radiology Department [facility]) - [phone] or [phone]  [facility] - [phone] [facility] - [phone] [facility] - [phone]  *ask for the Radiology Department [facility] - [phone]  *ask for the Radiology Department [facility] - [phone]  *ask for the Mammography Department [facility] - [phone]    Health Maintenance After Age 15 After age 47, you are at a higher risk for certain long-term diseases and infections as well as injuries from falls. Falls are a major cause of broken bones and head injuries in people who are older than age 40. Getting regular preventive care can help to keep you healthy and well. Preventive care includes getting regular testing and making lifestyle changes as recommended by your health care provider. Talk with your health care provider about:  Which screenings and tests you should have. A screening is a test that checks for a disease when you have no symptoms.  A diet and exercise plan that is right for you. What should I know about screenings and tests to prevent falls? Screening and testing are the best ways to find a health  problem early. Early diagnosis and treatment give you the best chance of managing medical conditions that are common after age 42. Certain conditions and lifestyle choices may make you more likely to have a fall. Your health care provider may recommend:  Regular vision checks. Poor vision and conditions such as cataracts can make you more likely to have a fall. If you wear glasses, make sure to get your prescription updated if your vision changes.  Medicine review. Work with your health care provider to regularly review all of the medicines you are taking, including over-the-counter medicines. Ask your health care provider about any side effects that may make you more likely to have a fall. Tell your health care provider if any medicines that you take make you feel dizzy or sleepy.  Osteoporosis screening. Osteoporosis is a condition that causes the bones to get weaker. This can make the bones weak and cause them to break more easily.  Blood pressure screening. Blood pressure changes and medicines to control blood pressure can make you feel dizzy.  Strength and balance checks. Your health care provider may recommend certain tests to check your strength and balance while standing, walking, or changing positions.  Foot health exam. Foot pain and numbness, as well as not wearing proper footwear, can make you more likely to have a fall.  Depression screening. You may be more likely to have a fall if you have a fear of falling, feel emotionally low, or feel unable to do activities that you used to do.  Alcohol use screening. Using too much alcohol can affect your balance and may make you more likely to have a fall. What actions can I take  to lower my risk of falls? General instructions  Talk with your health care provider about your risks for falling. Tell your health care provider if: ? You fall. Be sure to tell your health care provider about all falls, even ones that seem minor. ? You feel dizzy,  sleepy, or off-balance.  Take over-the-counter and prescription medicines only as told by your health care provider. These include any supplements.  Eat a healthy diet and maintain a healthy weight. A healthy diet includes low-fat dairy products, low-fat (lean) meats, and fiber from whole grains, beans, and lots of fruits and vegetables. Home safety  Remove any tripping hazards, such as rugs, cords, and clutter.  Install safety equipment such as grab bars in bathrooms and safety rails on stairs.  Keep rooms and walkways well-lit. Activity   Follow a regular exercise program to stay fit. This will help you maintain your balance. Ask your health care provider what types of exercise are appropriate for you.  If you need a cane or walker, use it as recommended by your health care provider.  Wear supportive shoes that have nonskid soles. Lifestyle  Do not drink alcohol if your health care provider tells you not to drink.  If you drink alcohol, limit how much you have: ? 0-1 drink a day for women. ? 0-2 drinks a day for men.  Be aware of how much alcohol is in your drink. In the [place] one drink equals one typical bottle of beer (12 oz), one-half glass of wine (5 oz), or one shot of hard liquor (1 oz).  Do not use any products that contain nicotine or tobacco, such as cigarettes and e-cigarettes. If you need help quitting, ask your health care provider. Summary  Having a healthy lifestyle and getting preventive care can help to protect your health and wellness after age 67.  Screening and testing are the best way to find a health problem early and help you avoid having a fall. Early diagnosis and treatment give you the best chance for managing medical conditions that are more common for people who are older than age 72.  Falls are a major cause of broken bones and head injuries in people who are older than age 70. Take precautions to prevent a fall at home.  Work with your health  care provider to learn what changes you can make to improve your health and wellness and to prevent falls. This information is not intended to replace advice given to you by your health care provider. Make sure you discuss any questions you have with your health care provider. Document Revised: 09/24/2018 Document Reviewed: 04/16/2017 Elsevier Patient Education  [employer].   If you have lab work done today you will be contacted with your lab results within the next 2 weeks.  If you have not heard from [place] then please contact us. The fastest way to get your results is to register for My Chart.   IF you received an x-ray today, you will receive an invoice from [facility]. Please contact [facility] at [phone] with questions or concerns regarding your invoice.   IF you received labwork today, you will receive an invoice from [facility]. Please contact [facility] at [phone] with questions or concerns regarding your invoice.   Our billing staff will not be able to assist you with questions regarding bills from these companies.  You will be contacted with the lab results as soon as they are available. The fastest way to get  your results is to activate your My Chart account. Instructions are located on the last page of this paperwork. If you have not heard from [place] regarding the results in 2 weeks, please contact this office.

## 2020-04-21 ENCOUNTER — Other Ambulatory Visit: Payer: Self-pay | Admitting: Family Medicine

## 2020-04-21 DIAGNOSIS — Z1231 Encounter for screening mammogram for malignant neoplasm of breast: Secondary | ICD-10-CM

## 2020-04-21 LAB — VITAMIN D 25 HYDROXY (VIT D DEFICIENCY, FRACTURES): Vit D, 25-Hydroxy: 39.4 ng/mL (ref 30.0–100.0)

## 2020-04-21 LAB — CMP14+EGFR
ALT: 10 IU/L (ref 0–32)
AST: 16 IU/L (ref 0–40)
Albumin/Globulin Ratio: 1.4 (ref 1.2–2.2)
Albumin: 4 g/dL (ref 3.7–4.7)
Alkaline Phosphatase: 153 IU/L — ABNORMAL HIGH (ref 44–121)
BUN/Creatinine Ratio: 13 (ref 12–28)
BUN: 10 mg/dL (ref 8–27)
Bilirubin Total: 0.4 mg/dL (ref 0.0–1.2)
CO2: 29 mmol/L (ref 20–29)
Calcium: 9.2 mg/dL (ref 8.7–10.3)
Chloride: 99 mmol/L (ref 96–106)
Creatinine, Ser: 0.79 mg/dL (ref 0.57–1.00)
GFR calc Af Amer: 85 mL/min/{1.73_m2} (ref >59)
GFR calc non Af Amer: 73 mL/min/{1.73_m2} (ref >59)
Globulin, Total: 2.8 g/dL (ref 1.5–4.5)
Glucose: 86 mg/dL (ref 65–99)
Potassium: 3.6 mmol/L (ref 3.5–5.2)
Sodium: 141 mmol/L (ref 134–144)
Total Protein: 6.8 g/dL (ref 6.0–8.5)

## 2020-04-21 LAB — CBC
Hematocrit: 43.8 % (ref 34.0–46.6)
Hemoglobin: 14.6 g/dL (ref 11.1–15.9)
MCH: 27.8 pg (ref 26.6–33.0)
MCHC: 33.3 g/dL (ref 31.5–35.7)
MCV: 83 fL (ref 79–97)
Platelets: 297 10*3/uL (ref 150–450)
RBC: 5.26 x10E6/uL (ref 3.77–5.28)
RDW: 14.3 % (ref 11.7–15.4)
WBC: 4.6 10*3/uL (ref 3.4–10.8)

## 2020-04-21 LAB — HEMOGLOBIN A1C
Est. average glucose Bld gHb Est-mCnc: 131 mg/dL
Hgb A1c MFr Bld: 6.2 % — ABNORMAL HIGH (ref 4.8–5.6)

## 2020-04-21 LAB — LIPID PANEL
Chol/HDL Ratio: 3.1 ratio (ref 0.0–4.4)
Cholesterol, Total: 267 mg/dL — ABNORMAL HIGH (ref 100–199)
HDL: 86 mg/dL (ref >39)
LDL Chol Calc (NIH): 173 mg/dL — ABNORMAL HIGH (ref 0–99)
Triglycerides: 55 mg/dL (ref 0–149)
VLDL Cholesterol Cal: 8 mg/dL (ref 5–40)

## 2020-04-21 LAB — TSH: TSH: 2.83 u[IU]/mL (ref 0.450–4.500)

## 2020-04-21 LAB — ANA: Anti Nuclear Antibody (ANA): NEGATIVE

## 2020-04-21 NOTE — Progress Notes (Signed)
Hello, If you could let Isabella Berg know her TSH is stable, no changes to medications needed. Her A1c is in the prediabetes range and her cholesterol remains elevated, no new medications are indicated but continue to work on increasing exercise and improving diet as able. Her xrays of her wrists showed no bone deformities, but did show soft tissue swelling. A referral was placed for ortho and they should be in contact with you soon. Thanks!

## 2020-04-26 ENCOUNTER — Encounter: Payer: Self-pay | Admitting: Orthopaedic Surgery

## 2020-04-26 ENCOUNTER — Other Ambulatory Visit: Payer: Self-pay

## 2020-04-26 ENCOUNTER — Ambulatory Visit: Payer: Medicare PPO | Admitting: Orthopaedic Surgery

## 2020-04-26 DIAGNOSIS — M778 Other enthesopathies, not elsewhere classified: Secondary | ICD-10-CM | POA: Diagnosis not present

## 2020-04-26 DIAGNOSIS — M67431 Ganglion, right wrist: Secondary | ICD-10-CM

## 2020-04-26 MED ORDER — DICLOFENAC SODIUM 75 MG PO TBEC: 75.0000 mg | DELAYED_RELEASE_TABLET | Freq: Two times a day (BID) | ORAL | 0 refills | Status: AC | PRN

## 2020-04-26 MED ORDER — METHYLPREDNISOLONE 4 MG PO TBPK: ORAL_TABLET | ORAL | 0 refills | Status: AC

## 2020-04-26 NOTE — Progress Notes (Signed)
Office Visit Note   Patient: Isabella Berg           Date of Birth: Mar 24, 1946           MRN: [id number] Visit Date: 04/26/2020              Requested by: Tobie Poet, FNP [address] [place],  [place] [address] PCP: Just, Azalee Course, FNP   Assessment & Plan: Visit Diagnoses:  1. Ganglion cyst of wrist, right   2. Left wrist tendinitis     Plan: Impression is right wrist ganglion cyst x2 and left wrist ECU tendinitis.  In regards to the right wrist, these are asymptomatic so we will ignore these for now.  Should the size significantly change or should they become painful, she will let us know.  In regards to the left wrist, have started her on a steroid taper.  Have also called in Voltaren pills to take once she is finished the steroid.  She has Voltaren cream at home which she will start using as well.  We have also provided her with a removable wrist splint to wear for the next few weeks.  Should her symptoms not improve over the next 4 weeks, she will come back in and see [place].  Call with concerns or questions in meantime.  Follow-Up Instructions: Return if symptoms worsen or fail to improve.   Orders:  No orders of the defined types were placed in this encounter.  Meds ordered this encounter  Medications  . methylPREDNISolone (MEDROL DOSEPAK) 4 MG TBPK tablet    Sig: Take as directed    Dispense:  21 tablet    Refill:  0  . diclofenac (VOLTAREN) 75 MG EC tablet    Sig: Take 1 tablet (75 mg total) by mouth 2 (two) times daily as needed. Do not start taking this until you have finished the steroid (medrol) pack    Dispense:  60 tablet    Refill:  0      Procedures: No procedures performed   Clinical Data: No additional findings.   Subjective: Chief Complaint  Patient presents with  . Right Hand - Pain  . Left Hand - Pain    HPI patient is a very pleasant right-hand-dominant 74 year old female who comes in today with concerns about both wrists.  In regards to her  right hand, she noticed formation of 2 cysts approximately 1 month ago.  One of them she notes has gradually moved from the ulnar side to the radial side of her wrist.  She has noticed a slight increase in size.  These are nontender.  No skin changes.  No fevers or chills.  She has not had a injury to that wrist that she is aware of.  She does, however note pain when making a fist due to stiffness.  She has not taken any over-the-counter pain medication for this.  In regards to the left wrist, all of her pain is to the ulnar side over the ulnar styloid.  She has increased pain with certain movements of the wrist.  The pain is aggravated on occasion by pressure to the area.  She does note occasional burning sensations to the left wrist.  She has not taken any over-the-counter pain medication for this either.  Review of Systems as detailed in HPI.  All others reviewed and are negative.   Objective: Vital Signs: There were no vitals taken for this visit.  Physical Exam well-developed well-nourished female no acute  distress.  Alert and oriented x3.  Ortho Exam examination of her right wrist reveals 2 pea-sized cysts 1 which is to the first dorsal compartment and the other just volar to that.  These are nontender.  These are slightly mobile.  There are no skin changes.  In regards to the left wrist, she has slight swelling and moderate pain to the ulnar styloid.  She has increased pain with wrist extension and ulnar deviation.  Fingers are warm well perfused.  She is neurovascular intact distally.  Specialty Comments:  No specialty comments available.  Imaging: X-rays reviewed by me in canopy show slight degenerative changes to the left wrist and hand.  Otherwise, no acute findings to either wrist.   PMFS History: Patient Active Problem List   Diagnosis Date Noted  . Osteopenia 07/09/2017  . Overweight (BMI 25.0-29.9) 07/09/2017  . Prediabetes 07/09/2017  . Vitamin D deficiency 07/07/2017  .  Hyperlipidemia 11/02/2015  . Benign essential HTN 08/17/2015  . Hypothyroidism 08/17/2015   Past Medical History:  Diagnosis Date  . Depression    [employer] 04/20/2020  . Hyperlipidemia   . Hypertension   . Thyroid disease     Family History  Problem Relation Age of Onset  . Hyperlipidemia Mother   . Cancer Father   . Hypertension Brother   . Stroke Brother   . Hyperlipidemia Brother     Past Surgical History:  Procedure Laterality Date  . thyroid     Removal 2002  . TUBAL LIGATION N/A    [employer] 04/20/2020   Social History   Occupational History  . 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 Smoking status: Former Games developer  . Smokeless tobacco: Never Used  Substance and Sexual Activity  . Alcohol use: No  . Drug use: No  . Sexual activity: Not on file

## 2020-05-02 ENCOUNTER — Ambulatory Visit
Admission: RE | Admit: 2020-05-02 | Discharge: 2020-05-02 | Disposition: A | Discharge status: Home / Self Care | Payer: Medicare PPO | Source: Ambulatory Visit | Attending: Family Medicine | Admitting: Family Medicine

## 2020-05-02 ENCOUNTER — Other Ambulatory Visit: Payer: Self-pay

## 2020-05-02 DIAGNOSIS — Z1231 Encounter for screening mammogram for malignant neoplasm of breast: Secondary | ICD-10-CM

## 2020-05-15 ENCOUNTER — Other Ambulatory Visit: Payer: Self-pay

## 2020-05-15 ENCOUNTER — Telehealth: Payer: Self-pay | Admitting: Family Medicine

## 2020-05-15 DIAGNOSIS — I10 Essential (primary) hypertension: Secondary | ICD-10-CM

## 2020-05-15 MED ORDER — POTASSIUM CHLORIDE CRYS ER 10 MEQ PO TBCR: EXTENDED_RELEASE_TABLET | ORAL | 0 refills | Status: DC

## 2020-05-15 NOTE — Telephone Encounter (Signed)
05/15/2020 - PATIENT CAME BY THE OFFICE ASKING FOR A REFILL ON HER POTASSIUM CHLORIDE  (KLOR-CON) 10 meq. SHE STATES SHE IS COMPLETELY OUT. SHE SAID HER PHARMACY SHOULD HAVE CONTACTED [place]. BEST PHONE FOR PATIENT IS: [phone] (CELL)   PHARMACY CHOICE IS: [facility] [address]  [place]

## 2020-05-16 ENCOUNTER — Other Ambulatory Visit: Payer: Self-pay | Admitting: Physician Assistant

## 2020-08-05 ENCOUNTER — Other Ambulatory Visit: Payer: Self-pay | Admitting: Family Medicine

## 2020-08-05 DIAGNOSIS — I10 Essential (primary) hypertension: Secondary | ICD-10-CM

## 2020-10-02 ENCOUNTER — Other Ambulatory Visit: Payer: Self-pay

## 2020-10-02 ENCOUNTER — Ambulatory Visit
Admission: RE | Admit: 2020-10-02 | Discharge: 2020-10-02 | Disposition: A | Discharge status: Home / Self Care | Payer: Medicare PPO | Source: Ambulatory Visit | Attending: Family Medicine | Admitting: Family Medicine

## 2020-10-02 DIAGNOSIS — E2839 Other primary ovarian failure: Secondary | ICD-10-CM

## 2020-10-19 ENCOUNTER — Ambulatory Visit: Payer: Medicare PPO | Admitting: Family Medicine

## 2021-06-18 ENCOUNTER — Other Ambulatory Visit: Payer: Self-pay | Admitting: Emergency Medicine

## 2021-06-18 DIAGNOSIS — I10 Essential (primary) hypertension: Secondary | ICD-10-CM

## 2022-10-01 ENCOUNTER — Other Ambulatory Visit: Payer: Self-pay | Admitting: Geriatric Medicine

## 2022-10-01 DIAGNOSIS — M8580 Other specified disorders of bone density and structure, unspecified site: Secondary | ICD-10-CM

## 2023-04-09 ENCOUNTER — Ambulatory Visit
Admission: RE | Admit: 2023-04-09 | Discharge: 2023-04-09 | Disposition: A | Discharge status: Home / Self Care | Payer: Medicare PPO | Source: Ambulatory Visit | Attending: Geriatric Medicine | Admitting: Geriatric Medicine

## 2023-04-09 DIAGNOSIS — M8580 Other specified disorders of bone density and structure, unspecified site: Secondary | ICD-10-CM
